# Patient Record
Sex: Female | Born: 1955
Health system: Southern US, Community
[De-identification: ages and names within clinical notes are randomized; demographics above are authoritative.]

## PROBLEM LIST (undated history)

## (undated) DIAGNOSIS — E785 Hyperlipidemia, unspecified: Secondary | ICD-10-CM

## (undated) DIAGNOSIS — R7303 Prediabetes: Secondary | ICD-10-CM

## (undated) DIAGNOSIS — G4733 Obstructive sleep apnea (adult) (pediatric): Secondary | ICD-10-CM

## (undated) DIAGNOSIS — D649 Anemia, unspecified: Secondary | ICD-10-CM

## (undated) HISTORY — PX: COLONOSCOPY: SHX174

## (undated) HISTORY — PX: MYOMECTOMY: SHX85

## (undated) HISTORY — DX: Anemia, unspecified: D64.9

## (undated) HISTORY — DX: Prediabetes: R73.03

## (undated) HISTORY — PX: ABDOMINAL HYSTERECTOMY: SHX81

## (undated) HISTORY — DX: Hyperlipidemia, unspecified: E78.5

## (undated) HISTORY — DX: Obstructive sleep apnea (adult) (pediatric): G47.33

---

## 1998-01-09 ENCOUNTER — Emergency Department (HOSPITAL_COMMUNITY): Admission: EM | Admit: 1998-01-09 | Discharge: 1998-01-09 | Payer: Self-pay

## 1998-04-20 ENCOUNTER — Encounter: Payer: Self-pay | Admitting: Internal Medicine

## 1998-04-20 ENCOUNTER — Ambulatory Visit: Admission: RE | Admit: 1998-04-20 | Discharge: 1998-04-20 | Payer: Self-pay | Admitting: Internal Medicine

## 2000-07-18 ENCOUNTER — Encounter (INDEPENDENT_AMBULATORY_CARE_PROVIDER_SITE_OTHER): Payer: Self-pay | Admitting: Specialist

## 2000-07-18 ENCOUNTER — Ambulatory Visit (HOSPITAL_COMMUNITY): Admission: RE | Admit: 2000-07-18 | Discharge: 2000-07-18 | Payer: Self-pay | Admitting: Obstetrics and Gynecology

## 2001-06-03 ENCOUNTER — Other Ambulatory Visit: Admission: RE | Admit: 2001-06-03 | Discharge: 2001-06-03 | Payer: Self-pay | Admitting: Obstetrics and Gynecology

## 2002-06-16 ENCOUNTER — Other Ambulatory Visit: Admission: RE | Admit: 2002-06-16 | Discharge: 2002-06-16 | Payer: Self-pay | Admitting: Obstetrics and Gynecology

## 2004-04-05 ENCOUNTER — Ambulatory Visit: Payer: Self-pay | Admitting: Internal Medicine

## 2004-04-12 ENCOUNTER — Ambulatory Visit: Payer: Self-pay | Admitting: Internal Medicine

## 2004-06-22 ENCOUNTER — Observation Stay (HOSPITAL_COMMUNITY): Admission: RE | Admit: 2004-06-22 | Discharge: 2004-06-23 | Payer: Self-pay | Admitting: Obstetrics and Gynecology

## 2004-06-22 ENCOUNTER — Encounter (INDEPENDENT_AMBULATORY_CARE_PROVIDER_SITE_OTHER): Payer: Self-pay | Admitting: *Deleted

## 2004-12-13 ENCOUNTER — Ambulatory Visit: Payer: Self-pay | Admitting: Internal Medicine

## 2004-12-19 ENCOUNTER — Ambulatory Visit: Payer: Self-pay | Admitting: Internal Medicine

## 2007-02-18 ENCOUNTER — Ambulatory Visit: Payer: Self-pay | Admitting: Internal Medicine

## 2007-02-18 LAB — CONVERTED CEMR LAB
ALT: 26 units/L (ref 0–35)
AST: 29 units/L (ref 0–37)
Albumin: 3.9 g/dL (ref 3.5–5.2)
Alkaline Phosphatase: 55 units/L (ref 39–117)
BUN: 7 mg/dL (ref 6–23)
Basophils Absolute: 0 10*3/uL (ref 0.0–0.1)
Basophils Relative: 0.6 % (ref 0.0–1.0)
Bilirubin Urine: NEGATIVE
Bilirubin, Direct: 0.1 mg/dL (ref 0.0–0.3)
Blood in Urine, dipstick: NEGATIVE
CO2: 31 meq/L (ref 19–32)
Calcium: 9.9 mg/dL (ref 8.4–10.5)
Chloride: 110 meq/L (ref 96–112)
Cholesterol: 225 mg/dL (ref 0–200)
Creatinine, Ser: 0.9 mg/dL (ref 0.4–1.2)
Direct LDL: 132.8 mg/dL
Eosinophils Absolute: 0.1 10*3/uL (ref 0.0–0.6)
Eosinophils Relative: 2.4 % (ref 0.0–5.0)
GFR calc Af Amer: 86 mL/min
GFR calc non Af Amer: 71 mL/min
Glucose, Bld: 96 mg/dL (ref 70–99)
Glucose, Urine, Semiquant: NEGATIVE
HCT: 37.2 % (ref 36.0–46.0)
HDL: 78.6 mg/dL (ref >39.0)
Hemoglobin: 12.6 g/dL (ref 12.0–15.0)
Ketones, urine, test strip: NEGATIVE
Lymphocytes Relative: 40.6 % (ref 12.0–46.0)
MCHC: 34 g/dL (ref 30.0–36.0)
MCV: 88.5 fL (ref 78.0–100.0)
Monocytes Absolute: 0.3 10*3/uL (ref 0.2–0.7)
Monocytes Relative: 8.1 % (ref 3.0–11.0)
Neutro Abs: 1.7 10*3/uL (ref 1.4–7.7)
Neutrophils Relative %: 48.3 % (ref 43.0–77.0)
Nitrite: NEGATIVE
Platelets: 294 10*3/uL (ref 150–400)
Potassium: 5.4 meq/L — ABNORMAL HIGH (ref 3.5–5.1)
RBC: 4.2 M/uL (ref 3.87–5.11)
RDW: 12.8 % (ref 11.5–14.6)
Sodium: 145 meq/L (ref 135–145)
Specific Gravity, Urine: 1.02
TSH: 1.43 microintl units/mL (ref 0.35–5.50)
Total Bilirubin: 0.7 mg/dL (ref 0.3–1.2)
Total CHOL/HDL Ratio: 2.9
Total Protein: 6.6 g/dL (ref 6.0–8.3)
Triglycerides: 44 mg/dL (ref 0–149)
Urobilinogen, UA: NEGATIVE
VLDL: 9 mg/dL (ref 0–40)
WBC Urine, dipstick: NEGATIVE
WBC: 3.5 10*3/uL — ABNORMAL LOW (ref 4.5–10.5)
pH: 8.5

## 2007-02-25 ENCOUNTER — Ambulatory Visit: Payer: Self-pay | Admitting: Internal Medicine

## 2007-02-25 DIAGNOSIS — D649 Anemia, unspecified: Secondary | ICD-10-CM

## 2007-02-25 DIAGNOSIS — E785 Hyperlipidemia, unspecified: Secondary | ICD-10-CM | POA: Insufficient documentation

## 2007-02-26 ENCOUNTER — Telehealth: Payer: Self-pay | Admitting: *Deleted

## 2007-06-10 ENCOUNTER — Ambulatory Visit: Payer: Self-pay | Admitting: Internal Medicine

## 2007-06-10 LAB — CONVERTED CEMR LAB
Cholesterol: 221 mg/dL (ref 0–200)
Direct LDL: 134.2 mg/dL
HDL: 68.9 mg/dL (ref >39.0)
Total CHOL/HDL Ratio: 3.2
Triglycerides: 43 mg/dL (ref 0–149)
VLDL: 9 mg/dL (ref 0–40)

## 2007-06-17 ENCOUNTER — Ambulatory Visit: Payer: Self-pay | Admitting: Internal Medicine

## 2007-06-17 LAB — CONVERTED CEMR LAB
Cholesterol, target level: 200 mg/dL
HDL goal, serum: 40 mg/dL
LDL Goal: 160 mg/dL

## 2007-08-09 ENCOUNTER — Telehealth: Payer: Self-pay | Admitting: Internal Medicine

## 2007-10-10 LAB — CONVERTED CEMR LAB: Pap Smear: NORMAL

## 2007-12-09 ENCOUNTER — Ambulatory Visit: Payer: Self-pay | Admitting: Internal Medicine

## 2007-12-09 LAB — CONVERTED CEMR LAB
ALT: 17 units/L (ref 0–35)
AST: 26 units/L (ref 0–37)
Albumin: 3.8 g/dL (ref 3.5–5.2)
Alkaline Phosphatase: 45 units/L (ref 39–117)
Bilirubin, Direct: 0.1 mg/dL (ref 0.0–0.3)
Cholesterol: 225 mg/dL (ref 0–200)
Direct LDL: 129.6 mg/dL
HDL: 76.8 mg/dL (ref >39.0)
Total Bilirubin: 0.7 mg/dL (ref 0.3–1.2)
Total CHOL/HDL Ratio: 2.9
Total Protein: 6.3 g/dL (ref 6.0–8.3)
Triglycerides: 46 mg/dL (ref 0–149)
VLDL: 9 mg/dL (ref 0–40)

## 2007-12-16 ENCOUNTER — Ambulatory Visit: Payer: Self-pay | Admitting: Internal Medicine

## 2008-06-15 ENCOUNTER — Ambulatory Visit: Payer: Self-pay | Admitting: Internal Medicine

## 2008-06-15 LAB — CONVERTED CEMR LAB
Albumin: 4.1 g/dL (ref 3.5–5.2)
Alkaline Phosphatase: 52 units/L (ref 39–117)
BUN: 8 mg/dL (ref 6–23)
Basophils Absolute: 0 10*3/uL (ref 0.0–0.1)
Bilirubin Urine: NEGATIVE
Blood in Urine, dipstick: NEGATIVE
Chloride: 106 meq/L (ref 96–112)
Cholesterol: 237 mg/dL (ref 0–200)
Direct LDL: 137.1 mg/dL
Eosinophils Absolute: 0.1 10*3/uL (ref 0.0–0.7)
Eosinophils Relative: 2.2 % (ref 0.0–5.0)
GFR calc Af Amer: 85 mL/min
GFR calc non Af Amer: 70 mL/min
Glucose, Urine, Semiquant: NEGATIVE
HCT: 38.3 % (ref 36.0–46.0)
HDL: 81 mg/dL (ref >39.0)
Ketones, urine, test strip: NEGATIVE
MCHC: 33.4 g/dL (ref 30.0–36.0)
MCV: 89.8 fL (ref 78.0–100.0)
Monocytes Absolute: 0.3 10*3/uL (ref 0.1–1.0)
Neutrophils Relative %: 48.6 % (ref 43.0–77.0)
Nitrite: NEGATIVE
Platelets: 280 10*3/uL (ref 150–400)
Potassium: 4 meq/L (ref 3.5–5.1)
Protein, U semiquant: NEGATIVE
RDW: 12.8 % (ref 11.5–14.6)
Specific Gravity, Urine: 1.02
Total Bilirubin: 0.6 mg/dL (ref 0.3–1.2)
Triglycerides: 58 mg/dL (ref 0–149)
Urobilinogen, UA: 0.2
WBC Urine, dipstick: NEGATIVE
WBC: 3.4 10*3/uL — ABNORMAL LOW (ref 4.5–10.5)
pH: 5.5

## 2008-06-22 ENCOUNTER — Ambulatory Visit: Payer: Self-pay | Admitting: Internal Medicine

## 2008-06-22 DIAGNOSIS — M899 Disorder of bone, unspecified: Secondary | ICD-10-CM | POA: Insufficient documentation

## 2008-06-22 DIAGNOSIS — M949 Disorder of cartilage, unspecified: Secondary | ICD-10-CM

## 2008-06-24 LAB — CONVERTED CEMR LAB: Vit D, 25-Hydroxy: 19 ng/mL — ABNORMAL LOW (ref 30–89)

## 2008-07-08 ENCOUNTER — Ambulatory Visit: Payer: Self-pay | Admitting: Gastroenterology

## 2008-07-22 ENCOUNTER — Ambulatory Visit: Payer: Self-pay | Admitting: Gastroenterology

## 2008-10-28 ENCOUNTER — Encounter: Payer: Self-pay | Admitting: Internal Medicine

## 2008-11-21 LAB — HM MAMMOGRAPHY

## 2008-11-21 LAB — CONVERTED CEMR LAB: Pap Smear: NORMAL

## 2009-01-08 ENCOUNTER — Ambulatory Visit: Payer: Self-pay | Admitting: Internal Medicine

## 2009-01-13 ENCOUNTER — Telehealth: Payer: Self-pay | Admitting: Internal Medicine

## 2009-01-14 ENCOUNTER — Telehealth (INDEPENDENT_AMBULATORY_CARE_PROVIDER_SITE_OTHER): Payer: Self-pay | Admitting: *Deleted

## 2009-01-18 LAB — CONVERTED CEMR LAB: Vit D, 25-Hydroxy: 20 ng/mL — ABNORMAL LOW (ref 30–89)

## 2009-07-12 ENCOUNTER — Ambulatory Visit: Payer: Self-pay | Admitting: Internal Medicine

## 2009-07-12 LAB — CONVERTED CEMR LAB
Albumin: 4.1 g/dL (ref 3.5–5.2)
Alkaline Phosphatase: 67 units/L (ref 39–117)
Basophils Relative: 0.5 % (ref 0.0–3.0)
CO2: 31 meq/L (ref 19–32)
Chloride: 108 meq/L (ref 96–112)
Eosinophils Absolute: 0.1 10*3/uL (ref 0.0–0.7)
HCT: 38.6 % (ref 36.0–46.0)
Hemoglobin: 12.4 g/dL (ref 12.0–15.0)
Lymphocytes Relative: 43.9 % (ref 12.0–46.0)
Lymphs Abs: 1.6 10*3/uL (ref 0.7–4.0)
MCHC: 32.2 g/dL (ref 30.0–36.0)
MCV: 90.3 fL (ref 78.0–100.0)
Monocytes Absolute: 0.2 10*3/uL (ref 0.1–1.0)
Neutro Abs: 1.7 10*3/uL (ref 1.4–7.7)
Nitrite: NEGATIVE
Potassium: 4.8 meq/L (ref 3.5–5.1)
RBC: 4.27 M/uL (ref 3.87–5.11)
Sodium: 142 meq/L (ref 135–145)
Specific Gravity, Urine: 1.02
Total CHOL/HDL Ratio: 3
Total Protein: 7.3 g/dL (ref 6.0–8.3)
Urobilinogen, UA: 0.2

## 2009-07-19 ENCOUNTER — Ambulatory Visit: Payer: Self-pay | Admitting: Internal Medicine

## 2009-07-19 DIAGNOSIS — IMO0002 Reserved for concepts with insufficient information to code with codable children: Secondary | ICD-10-CM | POA: Insufficient documentation

## 2009-07-19 DIAGNOSIS — M75100 Unspecified rotator cuff tear or rupture of unspecified shoulder, not specified as traumatic: Secondary | ICD-10-CM | POA: Insufficient documentation

## 2009-07-19 DIAGNOSIS — K7689 Other specified diseases of liver: Secondary | ICD-10-CM | POA: Insufficient documentation

## 2009-10-13 ENCOUNTER — Ambulatory Visit: Payer: Self-pay | Admitting: Internal Medicine

## 2009-10-13 LAB — CONVERTED CEMR LAB
Alkaline Phosphatase: 103 units/L (ref 39–117)
Bilirubin, Direct: 0.1 mg/dL (ref 0.0–0.3)
Total Protein: 7.1 g/dL (ref 6.0–8.3)
VLDL: 16.4 mg/dL (ref 0.0–40.0)

## 2009-10-20 ENCOUNTER — Ambulatory Visit: Payer: Self-pay | Admitting: Internal Medicine

## 2009-11-01 ENCOUNTER — Encounter: Payer: Self-pay | Admitting: Internal Medicine

## 2010-01-19 ENCOUNTER — Ambulatory Visit: Payer: Self-pay | Admitting: Internal Medicine

## 2010-01-19 LAB — CONVERTED CEMR LAB
Albumin: 3.9 g/dL (ref 3.5–5.2)
HDL: 81.3 mg/dL (ref >39.00)
LDL Cholesterol: 104 mg/dL — ABNORMAL HIGH (ref 0–99)
Total CHOL/HDL Ratio: 2
Triglycerides: 65 mg/dL (ref 0.0–149.0)

## 2010-01-26 ENCOUNTER — Ambulatory Visit: Payer: Self-pay | Admitting: Internal Medicine

## 2010-01-26 DIAGNOSIS — R1901 Right upper quadrant abdominal swelling, mass and lump: Secondary | ICD-10-CM | POA: Insufficient documentation

## 2010-01-28 ENCOUNTER — Ambulatory Visit: Payer: Self-pay | Admitting: Diagnostic Radiology

## 2010-01-28 ENCOUNTER — Ambulatory Visit (HOSPITAL_BASED_OUTPATIENT_CLINIC_OR_DEPARTMENT_OTHER): Admission: RE | Admit: 2010-01-28 | Discharge: 2010-01-28 | Payer: Self-pay | Admitting: Internal Medicine

## 2010-05-03 ENCOUNTER — Ambulatory Visit
Admission: RE | Admit: 2010-05-03 | Discharge: 2010-05-03 | Payer: Self-pay | Source: Home / Self Care | Attending: Internal Medicine | Admitting: Internal Medicine

## 2010-05-03 ENCOUNTER — Other Ambulatory Visit: Payer: Self-pay | Admitting: Internal Medicine

## 2010-05-03 LAB — HEPATIC FUNCTION PANEL
ALT: 48 U/L — ABNORMAL HIGH (ref 0–35)
AST: 36 U/L (ref 0–37)
Albumin: 3.8 g/dL (ref 3.5–5.2)
Alkaline Phosphatase: 91 U/L (ref 39–117)
Bilirubin, Direct: 0.1 mg/dL (ref 0.0–0.3)
Total Bilirubin: 0.6 mg/dL (ref 0.3–1.2)
Total Protein: 6.7 g/dL (ref 6.0–8.3)

## 2010-05-11 ENCOUNTER — Ambulatory Visit
Admission: RE | Admit: 2010-05-11 | Discharge: 2010-05-11 | Payer: Self-pay | Source: Home / Self Care | Attending: Internal Medicine | Admitting: Internal Medicine

## 2010-05-24 NOTE — Assessment & Plan Note (Signed)
Summary: cpx/mm   Vital Signs:  Patient profile:   55 year old female Height:      65 inches Weight:      192 pounds BMI:     32.07 Pulse rate:   80 / minute Resp:     14 per minute BP sitting:   130 / 80  (left arm)  Vitals Entered By: Willy Eddy, LPN (July 19, 2009 3:16 PM)  Nutrition Counseling: Patient's BMI is greater than 25 and therefore counseled on weight management options. CC: cpx- stopped vitamin d 50,000 units twice a week after 3 months-   CC:  cpx- stopped vitamin d 50 and 000 units twice a week after 3 months-.  History of Present Illness: The pt was asked about all immunizations, health maint. services that are appropriate to their age and was given guidance on diet exercize  and weight management  pt has shoulder pian ay night and cannot lay on the right side motion is limited to 90o pain with combing hair no acute injury  Preventive Screening-Counseling & Management  Alcohol-Tobacco     Smoking Status: quit     Year Quit: 1980     Passive Smoke Exposure: no  Current Problems (verified): 1)  Osteopenia  (ICD-733.90) 2)  Preventive Health Care  (ICD-V70.0) 3)  Family History Diabetes 1st Degree Relative  (ICD-V18.0) 4)  Hyperlipidemia  (ICD-272.4) 5)  Anemia-nos  (ICD-285.9)  Current Medications (verified): 1)  Calcium 500/d 500-125 Mg-Unit  Tabs (Calcium Carbonate-Vitamin D) .... Two Times A Day 2)  Fish Oil 1000 Mg Caps (Omega-3 Fatty Acids) .Marland Kitchen.. 1 Once Daily 3)  Trilipix 135 Mg Cpdr (Choline Fenofibrate) .... One By Mouth Daily 4)  Calcium + D 600-200 Mg-Unit Tabs (Calcium Carbonate-Vitamin D) .Marland Kitchen.. 1 Once Daily  Allergies (verified): No Known Drug Allergies  Past History:  Family History: Last updated: 01/08/2009 mother Family History Diabetes 1st degree relative Family History Hypertension father Family History of Prostate CA 1st degree relative <50 Father:  Mother:  Siblings:   Social History: Last updated:  02/25/2007 Married Former Smoker Alcohol use-no  Risk Factors: Smoking Status: quit (07/19/2009) Passive Smoke Exposure: no (07/19/2009)  Past medical, surgical, family and social histories (including risk factors) reviewed, and no changes noted (except as noted below).  Past Medical History: Reviewed history from 02/25/2007 and no changes required. Anemia-NOS Hyperlipidemia  Past Surgical History: Reviewed history from 02/25/2007 and no changes required. Hysterectomy myomectomy  Family History: Reviewed history from 01/08/2009 and no changes required. mother Family History Diabetes 1st degree relative Family History Hypertension father Family History of Prostate CA 1st degree relative <50 Father:  Mother:  Siblings:   Social History: Reviewed history from 02/25/2007 and no changes required. Married Former Smoker Alcohol use-no  Review of Systems  The patient denies anorexia, fever, weight loss, weight gain, vision loss, decreased hearing, hoarseness, chest pain, syncope, dyspnea on exertion, peripheral edema, prolonged cough, headaches, hemoptysis, abdominal pain, melena, hematochezia, severe indigestion/heartburn, hematuria, incontinence, genital sores, muscle weakness, suspicious skin lesions, transient blindness, difficulty walking, depression, unusual weight change, abnormal bleeding, enlarged lymph nodes, angioedema, and breast masses.    Contraindications/Deferment of Procedures/Staging:    Test/Procedure: FLU VAX    Reason for deferment: patient declined   Physical Exam  General:  Well-developed,well-nourished,in no acute distress; alert,appropriate and cooperative throughout examination Head:  Normocephalic and atraumatic without obvious abnormalities. No apparent alopecia or balding. Eyes:  pupils equal and pupils round.   Ears:  R ear normal  and L ear normal.   Nose:  no external deformity and no nasal discharge.   Neck:  No deformities, masses, or  tenderness noted. Lungs:  Normal respiratory effort, chest expands symmetrically. Lungs are clear to auscultation, no crackles or wheezes. Heart:  Normal rate and regular rhythm. S1 and S2 normal without gallop, murmur, click, rub or other extra sounds. Abdomen:  Bowel sounds positive,abdomen soft and non-tender without masses, organomegaly or hernias noted. Msk:  decreased ROM and joint tenderness.  right shoulder Pulses:  R and L carotid,radial,femoral,dorsalis pedis and posterior tibial pulses are full and equal bilaterally Extremities:  No clubbing, cyanosis, edema, or deformity noted with normal full range of motion of all joints.   Neurologic:  No cranial nerve deficits noted. Station and gait are normal. Plantar reflexes are down-going bilaterally. DTRs are symmetrical throughout. Sensory, motor and coordinative functions appear intact.   Impression & Recommendations:  Problem # 1:  PREVENTIVE HEALTH CARE (ICD-V70.0)  Mammogram: normal (11/21/2008) Pap smear: normal (11/21/2008) Colonoscopy: Location:  [facility].   (07/22/2008) Td Booster: Tdap (02/25/2007)   Chol: 236 (07/12/2009)   HDL: 88.30 (07/12/2009)   LDL: DEL (06/15/2008)   TG: 73.0 (07/12/2009) TSH: 1.84 (07/12/2009)   Next mammogram due:: 11/2009 (07/19/2009) Next Colonoscopy due:: 07/2018 (07/22/2008)  Discussed using sunscreen, use of alcohol, drug use, self breast exam, routine dental care, routine eye care, schedule for GYN exam, routine physical exam, seat belts, multiple vitamins, osteoporosis prevention, adequate calcium intake in diet, recommendations for immunizations, mammograms and Pap smears.  Discussed exercise and checking cholesterol.  Discussed gun safety, safe sex, and contraception.  Problem # 2:  FATTY LIVER DISEASE (ICD-571.8)  Problem # 3:  HYPERLIPIDEMIA (ICD-272.4)  The following medications were removed from the medication list:    Trilipix 135 Mg Cpdr (Choline fenofibrate) .....  One by mouth daily Her updated medication list for this problem includes:    Crestor 20 Mg Tabs (Rosuvastatin calcium) ..... One by mouth every  monday night  Labs Reviewed: SGOT: 53 (07/12/2009)   SGPT: 60 (07/12/2009)  Lipid Goals: Chol Goal: 200 (06/17/2007)   HDL Goal: 40 (06/17/2007)   LDL Goal: 160 (06/17/2007)   TG Goal: 150 (06/17/2007)  Prior 10 Yr Risk Heart Disease: Not enough information (06/17/2007)   HDL:88.30 (07/12/2009), 81.0 (06/15/2008)  LDL:DEL (06/15/2008), DEL (12/09/2007)  Chol:236 (07/12/2009), 237 (06/15/2008)  Trig:73.0 (07/12/2009), 58 (06/15/2008)  Problem # 4:  SUBACROMIAL BURSITIS, RIGHT (ICD-726.19)  Informed consent obtained and then the joint was prepped in a sterile manor and 40 mg depo and 1/2 cc 1% lidocaine injected into the synovial space. After care discussed. Pt tolerated procedure well.  Orders: Joint Aspirate / Injection, Large (20610) Depo- Medrol 40mg  (J1030)  Complete Medication List: 1)  Calcium 500/d 500-125 Mg-unit Tabs (Calcium carbonate-vitamin d) .... Two times a day 2)  Fish Oil 1000 Mg Caps (Omega-3 fatty acids) .Marland Kitchen.. 1 once daily 3)  Calcium + D 600-200 Mg-unit Tabs (Calcium carbonate-vitamin d) .Marland Kitchen.. 1 once daily 4)  Crestor 20 Mg Tabs (Rosuvastatin calcium) .... One by mouth every  monday night  Patient Instructions: 1)  Please schedule a follow-up appointment in 3 months. 2)  Hepatic Panel prior to visit, ICD-9:995.20 3)  Lipid Panel prior to visit, ICD-9:272.4     Preventive Care Screening  Mammogram:    Date:  11/21/2008    Next Due:  11/2009    Results:  normal   Pap Smear:    Date:  11/21/2008  Next Due:  11/2009    Results:  normal

## 2010-05-24 NOTE — Assessment & Plan Note (Signed)
Summary: 3 MTH ROV // RS   Vital Signs:  Patient profile:   55 year old female Height:      65 inches Weight:      195 pounds BMI:     32.57 Temp:     98.2 degrees F oral Pulse rate:   80 / minute Resp:     14 per minute BP sitting:   132 / 80  (left arm)  Vitals Entered By: Willy Eddy, LPN (October 20, 2009 2:47 PM) CC: elevated liver enzymeroa labs after chaning trilipix to crestor 20 q week, Lipid Management   CC:  elevated liver enzymeroa labs after chaning trilipix to crestor 20 q week and Lipid Management.  History of Present Illness: has not lost weight pt has fatty liver follow up trial of weekly  need to eat 3-4 meals  Lipid Management History:      Negative NCEP/ATP III risk factors include female age less than 65 years old, HDL cholesterol greater than 60, no family history for ischemic heart disease, non-tobacco-user status, non-hypertensive, no ASHD (atherosclerotic heart disease), no prior stroke/TIA, no peripheral vascular disease, and no history of aortic aneurysm.     Preventive Screening-Counseling & Management  Alcohol-Tobacco     Smoking Status: quit     Year Quit: 1980     Passive Smoke Exposure: no  Problems Prior to Update: 1)  Subacromial Bursitis, Right  (ICD-726.19) 2)  Fatty Liver Disease  (ICD-571.8) 3)  Osteopenia  (ICD-733.90) 4)  Preventive Health Care  (ICD-V70.0) 5)  Family History Diabetes 1st Degree Relative  (ICD-V18.0) 6)  Hyperlipidemia  (ICD-272.4) 7)  Anemia-nos  (ICD-285.9)  Current Problems (verified): 1)  Subacromial Bursitis, Right  (ICD-726.19) 2)  Fatty Liver Disease  (ICD-571.8) 3)  Osteopenia  (ICD-733.90) 4)  Preventive Health Care  (ICD-V70.0) 5)  Family History Diabetes 1st Degree Relative  (ICD-V18.0) 6)  Hyperlipidemia  (ICD-272.4) 7)  Anemia-nos  (ICD-285.9)  Medications Prior to Update: 1)  Calcium 500/d 500-125 Mg-Unit  Tabs (Calcium Carbonate-Vitamin D) .... Two Times A Day 2)  Fish Oil 1000 Mg Caps  (Omega-3 Fatty Acids) .Marland Kitchen.. 1 Once Daily 3)  Calcium + D 600-200 Mg-Unit Tabs (Calcium Carbonate-Vitamin D) .Marland Kitchen.. 1 Once Daily 4)  Crestor 20 Mg Tabs (Rosuvastatin Calcium) .... One By Mouth Every  Monday Night  Current Medications (verified): 1)  Fish Oil 1000 Mg Caps (Omega-3 Fatty Acids) .Marland Kitchen.. 1 Once Daily 2)  Calcium + D 600-200 Mg-Unit Tabs (Calcium Carbonate-Vitamin D) .Marland Kitchen.. 1 Once Daily 3)  Crestor 20 Mg Tabs (Rosuvastatin Calcium) .... One By Mouth Every  Monday Night  Allergies (verified): No Known Drug Allergies  Past History:  Family History: Last updated: 01/08/2009 mother Family History Diabetes 1st degree relative Family History Hypertension father Family History of Prostate CA 1st degree relative <50 Father:  Mother:  Siblings:   Social History: Last updated: 02/25/2007 Married Former Smoker Alcohol use-no  Risk Factors: Smoking Status: quit (10/20/2009) Passive Smoke Exposure: no (10/20/2009)  Past medical, surgical, family and social histories (including risk factors) reviewed, and no changes noted (except as noted below).  Past Medical History: Reviewed history from 02/25/2007 and no changes required. Anemia-NOS Hyperlipidemia  Past Surgical History: Reviewed history from 02/25/2007 and no changes required. Hysterectomy myomectomy  Family History: Reviewed history from 01/08/2009 and no changes required. mother Family History Diabetes 1st degree relative Family History Hypertension father Family History of Prostate CA 1st degree relative <50 Father:  Mother:  Siblings:  Social History: Reviewed history from 02/25/2007 and no changes required. Married Former Smoker Alcohol use-no  Review of Systems  The patient denies anorexia, fever, weight loss, weight gain, vision loss, decreased hearing, hoarseness, chest pain, syncope, dyspnea on exertion, peripheral edema, prolonged cough, headaches, hemoptysis, abdominal pain, melena,  hematochezia, severe indigestion/heartburn, hematuria, incontinence, genital sores, muscle weakness, suspicious skin lesions, transient blindness, difficulty walking, depression, unusual weight change, abnormal bleeding, enlarged lymph nodes, angioedema, and breast masses.    Physical Exam  General:  Well-developed,well-nourished,in no acute distress; alert,appropriate and cooperative throughout examination Head:  Normocephalic and atraumatic without obvious abnormalities. No apparent alopecia or balding. Eyes:  pupils equal and pupils round.   Ears:  R ear normal and L ear normal.   Nose:  no external deformity and no nasal discharge.   Mouth:  Oral mucosa and oropharynx without lesions or exudates.  Teeth in good repair. Neck:  No deformities, masses, or tenderness noted. Lungs:  Normal respiratory effort, chest expands symmetrically. Lungs are clear to auscultation, no crackles or wheezes. Heart:  Normal rate and regular rhythm. S1 and S2 normal without gallop, murmur, click, rub or other extra sounds.   Impression & Recommendations:  Problem # 1:  FATTY LIVER DISEASE (ICD-571.8) moderate inflamations chronic needed weight loss  Problem # 2:  HYPERLIPIDEMIA (ICD-272.4)  Her updated medication list for this problem includes:    Crestor 20 Mg Tabs (Rosuvastatin calcium) ..... One by mouth every  monday  and fridau night  Labs Reviewed: SGOT: 53 (07/12/2009)   SGPT: 60 (07/12/2009)  Lipid Goals: Chol Goal: 200 (06/17/2007)   HDL Goal: 40 (06/17/2007)   LDL Goal: 160 (06/17/2007)   TG Goal: 150 (06/17/2007)  Prior 10 Yr Risk Heart Disease: Not enough information (06/17/2007)   HDL:88.30 (07/12/2009), 81.0 (06/15/2008)  LDL:DEL (06/15/2008), DEL (12/09/2007)  Chol:236 (07/12/2009), 237 (06/15/2008)  Trig:73.0 (07/12/2009), 58 (06/15/2008)  Complete Medication List: 1)  Fish Oil 1000 Mg Caps (Omega-3 fatty acids) .Marland Kitchen.. 1 once daily 2)  Calcium + D 600-200 Mg-unit Tabs (Calcium  carbonate-vitamin d) .Marland Kitchen.. 1 once daily 3)  Crestor 20 Mg Tabs (Rosuvastatin calcium) .... One by mouth every  monday  and fridau night  Lipid Assessment/Plan:      Based on NCEP/ATP III, the patient's risk factor category is "0-1 risk factors".  The patient's lipid goals are as follows: Total cholesterol goal is 200; LDL cholesterol goal is 160; HDL cholesterol goal is 40; Triglyceride goal is 150.    Patient Instructions: 1)  Please schedule a follow-up appointment in 3 months. 2)  Hepatic Panel prior to visit, ICD-9:995.20 3)  Lipid Panel prior to visit, ICD-9:272.4

## 2010-05-24 NOTE — Assessment & Plan Note (Signed)
Summary: 3 month rov/njr   Vital Signs:  Patient profile:   55 year old female Height:      65 inches Weight:      198 pounds BMI:     33.07 Temp:     98.2 degrees F oral Pulse rate:   72 / minute Resp:     14 per minute BP sitting:   110 / 76  (left arm)  Vitals Entered By: Willy Eddy, LPN (January 26, 2010 2:52 PM)  Nutrition Counseling: Patient's BMI is greater than 25 and therefore counseled on weight management options. CC: roa labs, Lipid Management Is Patient Diabetic? No   Primary Care Provider:  Stacie Glaze MD  CC:  roa labs and Lipid Management.  History of Present Illness:  Hyperlipidemia Follow-Up      This is a 55 year old woman who presents for Hyperlipidemia follow-up.  The patient denies muscle aches, GI upset, abdominal pain, flushing, itching, constipation, diarrhea, and fatigue.  The patient denies the following symptoms: chest pain/pressure, exercise intolerance, dypsnea, palpitations, syncope, and pedal edema.  Compliance with medications (by patient report) has been intermittent.  Dietary compliance has been good.  The patient reports exercising 3-4X per week.  Adjunctive measures currently used by the patient include weight reduction.    Lipid Management History:      Negative NCEP/ATP III risk factors include female age less than 26 years old, HDL cholesterol greater than 60, no family history for ischemic heart disease, non-tobacco-user status, non-hypertensive, no ASHD (atherosclerotic heart disease), no prior stroke/TIA, no peripheral vascular disease, and no history of aortic aneurysm.     Preventive Screening-Counseling & Management  Alcohol-Tobacco     Smoking Status: quit     Year Quit: 1980     Passive Smoke Exposure: no     Tobacco Counseling: to remain off tobacco products  Problems Prior to Update: 1)  Abdominal or Pelvic Swelling Mass or Lump Ruq  (ICD-789.31) 2)  Subacromial Bursitis, Right  (ICD-726.19) 3)  Fatty Liver  Disease  (ICD-571.8) 4)  Osteopenia  (ICD-733.90) 5)  Preventive Health Care  (ICD-V70.0) 6)  Family History Diabetes 1st Degree Relative  (ICD-V18.0) 7)  Hyperlipidemia  (ICD-272.4) 8)  Anemia-nos  (ICD-285.9)  Current Problems (verified): 1)  Subacromial Bursitis, Right  (ICD-726.19) 2)  Fatty Liver Disease  (ICD-571.8) 3)  Osteopenia  (ICD-733.90) 4)  Preventive Health Care  (ICD-V70.0) 5)  Family History Diabetes 1st Degree Relative  (ICD-V18.0) 6)  Hyperlipidemia  (ICD-272.4) 7)  Anemia-nos  (ICD-285.9)  Medications Prior to Update: 1)  Fish Oil 1000 Mg Caps (Omega-3 Fatty Acids) .Marland Kitchen.. 1 Once Daily 2)  Calcium + D 600-200 Mg-Unit Tabs (Calcium Carbonate-Vitamin D) .Marland Kitchen.. 1 Once Daily 3)  Crestor 20 Mg Tabs (Rosuvastatin Calcium) .... One By Mouth Every  Monday  and Fridau Night  Current Medications (verified): 1)  Fish Oil 1000 Mg Caps (Omega-3 Fatty Acids) .Marland Kitchen.. 1 Once Daily 2)  Calcium + D 600-200 Mg-Unit Tabs (Calcium Carbonate-Vitamin D) .Marland Kitchen.. 1 Once Daily 3)  Crestor 20 Mg Tabs (Rosuvastatin Calcium) .... One By Mouth Every  Monday  and Fridau Night  Allergies (verified): No Known Drug Allergies  Contraindications/Deferment of Procedures/Staging:    Test/Procedure: FLU VAX    Reason for deferment: patient declined   Past History:  Family History: Last updated: 01/08/2009 mother Family History Diabetes 1st degree relative Family History Hypertension father Family History of Prostate CA 1st degree relative <50 Father:  Mother:  Siblings:   Social History: Last updated: 02/25/2007 Married Former Smoker Alcohol use-no  Risk Factors: Smoking Status: quit (01/26/2010) Passive Smoke Exposure: no (01/26/2010)  Past medical, surgical, family and social histories (including risk factors) reviewed, and no changes noted (except as noted below).  Past Medical History: Reviewed history from 02/25/2007 and no changes required. Anemia-NOS Hyperlipidemia  Past  Surgical History: Reviewed history from 02/25/2007 and no changes required. Hysterectomy myomectomy  Family History: Reviewed history from 01/08/2009 and no changes required. mother Family History Diabetes 1st degree relative Family History Hypertension father Family History of Prostate CA 1st degree relative <50 Father:  Mother:  Siblings:   Social History: Reviewed history from 02/25/2007 and no changes required. Married Former Smoker Alcohol use-no  Review of Systems  The patient denies anorexia, fever, weight loss, weight gain, vision loss, decreased hearing, hoarseness, chest pain, syncope, dyspnea on exertion, peripheral edema, prolonged cough, headaches, hemoptysis, abdominal pain, melena, hematochezia, severe indigestion/heartburn, hematuria, incontinence, genital sores, muscle weakness, suspicious skin lesions, transient blindness, difficulty walking, depression, unusual weight change, abnormal bleeding, enlarged lymph nodes, angioedema, and breast masses.    Physical Exam  General:  Well-developed,well-nourished,in no acute distress; alert,appropriate and cooperative throughout examination Head:  Normocephalic and atraumatic without obvious abnormalities. No apparent alopecia or balding. Eyes:  pupils equal and pupils round.   Ears:  R ear normal and L ear normal.   Nose:  no external deformity and no nasal discharge.   Mouth:  Oral mucosa and oropharynx without lesions or exudates.  Teeth in good repair. Neck:  No deformities, masses, or tenderness noted. Lungs:  Normal respiratory effort, chest expands symmetrically. Lungs are clear to auscultation, no crackles or wheezes. Heart:  Normal rate and regular rhythm. S1 and S2 normal without gallop, murmur, click, rub or other extra sounds.   Impression & Recommendations:  Problem # 1:  FATTY LIVER DISEASE (ICD-571.8) [place] of liver Orders: Radiology Referral (Radiology)  Problem # 2:  ABDOMINAL OR PELVIC SWELLING  MASS OR LUMP RUQ (ICD-789.31) persistant elevastion of the liver functons with presumed fatty lover dz slight change in lfts with a more obstructive pattern Orders: Radiology Referral (Radiology)  Problem # 3:  ANEMIA-NOS (ICD-285.9) Assessment: Unchanged  Hgb: 12.4 (07/12/2009)   Hct: 38.6 (07/12/2009)   Platelets: 246.0 (07/12/2009) RBC: 4.27 (07/12/2009)   RDW: 13.2 (07/12/2009)   WBC: 3.6 (07/12/2009) MCV: 90.3 (07/12/2009)   MCHC: 32.2 (07/12/2009) TSH: 1.84 (07/12/2009)  Complete Medication List: 1)  Fish Oil 1000 Mg Caps (Omega-3 fatty acids) .Marland Kitchen.. 1 once daily 2)  Calcium + D 600-200 Mg-unit Tabs (Calcium carbonate-vitamin d) .Marland Kitchen.. 1 once daily 3)  Crestor 20 Mg Tabs (Rosuvastatin calcium) .... One by mouth every monday and friday nights  Lipid Assessment/Plan:      Based on NCEP/ATP III, the patient's risk factor category is "0-1 risk factors".  The patient's lipid goals are as follows: Total cholesterol goal is 200; LDL cholesterol goal is 160; HDL cholesterol goal is 40; Triglyceride goal is 150.    Patient Instructions: 1)  Please schedule a follow-up appointment in 3 months. 2)  Hepatic Panel prior to visit, ICD-9:995.20 Prescriptions: CRESTOR 20 MG TABS (ROSUVASTATIN CALCIUM) one by mouth daily  #30 x 0   Entered and Authorized by:   Stacie Glaze MD   Signed by:   Stacie Glaze MD on 01/26/2010   Method used:   Print then Give to Patient   RxID:   [id number]

## 2010-05-26 NOTE — Assessment & Plan Note (Signed)
Summary: 3 MONTH ROV/NJR   Vital Signs:  Patient profile:   55 year old female Height:      65 inches Weight:      198 pounds BMI:     33.07 Temp:     98.2 degrees F oral Pulse rate:   72 / minute Resp:     14 per minute BP sitting:   116 / 76  (left arm)  Vitals Entered By: Willy Eddy, LPN (May 11, 2010 4:15 PM) CC: roa labs, Lipid Management Is Patient Diabetic? No   Primary Care Provider:  Stacie Glaze MD  CC:  roa labs and Lipid Management.  History of Present Illness: the pt presents for fatty liver follow up had a weigth loss epiphany and began to change her diet and work out with marked results in the liver functions  Lipid Management History:      Negative NCEP/ATP III risk factors include female age less than 63 years old, HDL cholesterol greater than 60, no family history for ischemic heart disease, non-tobacco-user status, non-hypertensive, no ASHD (atherosclerotic heart disease), no prior stroke/TIA, no peripheral vascular disease, and no history of aortic aneurysm.     Preventive Screening-Counseling & Management  Alcohol-Tobacco     Smoking Status: quit     Year Quit: 1980     Passive Smoke Exposure: no     Tobacco Counseling: to remain off tobacco products  Problems Prior to Update: 1)  Abdominal or Pelvic Swelling Mass or Lump Ruq  (ICD-789.31) 2)  Subacromial Bursitis, Right  (ICD-726.19) 3)  Fatty Liver Disease  (ICD-571.8) 4)  Osteopenia  (ICD-733.90) 5)  Preventive Health Care  (ICD-V70.0) 6)  Family History Diabetes 1st Degree Relative  (ICD-V18.0) 7)  Hyperlipidemia  (ICD-272.4) 8)  Anemia-nos  (ICD-285.9)  Current Problems (verified): 1)  Abdominal or Pelvic Swelling Mass or Lump Ruq  (ICD-789.31) 2)  Subacromial Bursitis, Right  (ICD-726.19) 3)  Fatty Liver Disease  (ICD-571.8) 4)  Osteopenia  (ICD-733.90) 5)  Preventive Health Care  (ICD-V70.0) 6)  Family History Diabetes 1st Degree Relative  (ICD-V18.0) 7)  Hyperlipidemia   (ICD-272.4) 8)  Anemia-nos  (ICD-285.9)  Medications Prior to Update: 1)  Fish Oil 1000 Mg Caps (Omega-3 Fatty Acids) .Marland Kitchen.. 1 Once Daily 2)  Calcium + D 600-200 Mg-Unit Tabs (Calcium Carbonate-Vitamin D) .Marland Kitchen.. 1 Once Daily 3)  Crestor 20 Mg Tabs (Rosuvastatin Calcium) .... One By Mouth Every Monday and Friday Nights  Current Medications (verified): 1)  Fish Oil 1000 Mg Caps (Omega-3 Fatty Acids) .Marland Kitchen.. 1 Once Daily 2)  Calcium + D 600-200 Mg-Unit Tabs (Calcium Carbonate-Vitamin D) .Marland Kitchen.. 1 Once Daily 3)  Crestor 20 Mg Tabs (Rosuvastatin Calcium) .... One By Mouth Every Monday and Friday Nights  Allergies (verified): No Known Drug Allergies  Past History:  Family History: Last updated: 01/08/2009 mother Family History Diabetes 1st degree relative Family History Hypertension father Family History of Prostate CA 1st degree relative <50 Father:  Mother:  Siblings:   Social History: Last updated: 02/25/2007 Married Former Smoker Alcohol use-no  Risk Factors: Smoking Status: quit (05/11/2010) Passive Smoke Exposure: no (05/11/2010)  Past medical, surgical, family and social histories (including risk factors) reviewed, and no changes noted (except as noted below).  Past Medical History: Reviewed history from 02/25/2007 and no changes required. Anemia-NOS Hyperlipidemia  Past Surgical History: Reviewed history from 02/25/2007 and no changes required. Hysterectomy myomectomy  Family History: Reviewed history from 01/08/2009 and no changes required. mother Family History Diabetes  1st degree relative Family History Hypertension father Family History of Prostate CA 1st degree relative <50 Father:  Mother:  Siblings:   Social History: Reviewed history from 02/25/2007 and no changes required. Married Former Smoker Alcohol use-no  Review of Systems  The patient denies anorexia, fever, weight loss, weight gain, vision loss, decreased hearing, hoarseness, chest pain,  syncope, dyspnea on exertion, peripheral edema, prolonged cough, headaches, hemoptysis, abdominal pain, melena, hematochezia, severe indigestion/heartburn, hematuria, incontinence, genital sores, muscle weakness, suspicious skin lesions, transient blindness, difficulty walking, depression, unusual weight change, abnormal bleeding, enlarged lymph nodes, angioedema, and breast masses.    Physical Exam  General:  Well-developed,well-nourished,in no acute distress; alert,appropriate and cooperative throughout examination Head:  Normocephalic and atraumatic without obvious abnormalities. No apparent alopecia or balding. Eyes:  pupils equal and pupils round.   Neck:  No deformities, masses, or tenderness noted. Lungs:  Normal respiratory effort, chest expands symmetrically. Lungs are clear to auscultation, no crackles or wheezes. Heart:  Normal rate and regular rhythm. S1 and S2 normal without gallop, murmur, click, rub or other extra sounds. Abdomen:  Bowel sounds positive,abdomen soft and non-tender without masses, organomegaly or hernias noted.   Impression & Recommendations:  Problem # 1:  FATTY LIVER DISEASE (ICD-571.8) Assessment Improved much improved liver functions [place] reviewed with pt as to etiology  Problem # 2:  HYPERLIPIDEMIA (ICD-272.4) Assessment: Improved the crestor twice a week is working Her updated medication list for this problem includes:    Crestor 20 Mg Tabs (Rosuvastatin calcium) ..... One by mouth every monday and friday nights  Labs Reviewed: SGOT: 36 (05/03/2010)   SGPT: 48 (05/03/2010)  Lipid Goals: Chol Goal: 200 (06/17/2007)   HDL Goal: 40 (06/17/2007)   LDL Goal: 160 (06/17/2007)   TG Goal: 150 (06/17/2007)  Prior 10 Yr Risk Heart Disease: 2 % (01/26/2010)   HDL:81.30 (01/19/2010), 86.70 (10/13/2009)  LDL:104 (01/19/2010), DEL (06/15/2008)  Chol:198 (01/19/2010), 229 (10/13/2009)  Trig:65.0 (01/19/2010), 82.0 (10/13/2009)  Complete Medication List: 1)   Fish Oil 1000 Mg Caps (Omega-3 fatty acids) .Marland Kitchen.. 1 once daily 2)  Calcium + D 600-200 Mg-unit Tabs (Calcium carbonate-vitamin d) .Marland Kitchen.. 1 once daily 3)  Crestor 20 Mg Tabs (Rosuvastatin calcium) .... One by mouth every monday and friday nights  Lipid Assessment/Plan:      Based on NCEP/ATP III, the patient's risk factor category is "0-1 risk factors".  The patient's lipid goals are as follows: Total cholesterol goal is 200; LDL cholesterol goal is 160; HDL cholesterol goal is 40; Triglyceride goal is 150.    Patient Instructions: 1)  weigth goal 160 and the pt can come off the crestor 2)  Please schedule a follow-up appointment in 5 months. 3)  Hepatic Panel prior to visit, ICD-9:995.20 4)  Lipid Panel prior to visit, ICD-9:272.4   Orders Added: 1)  Est. Patient Level III [24401]

## 2010-09-09 NOTE — Discharge Summary (Signed)
Shelia Smith, Shelia Smith               ACCOUNT NO.:  [id number]   MEDICAL RECORD NO.:  [id number]          PATIENT TYPE:  OBV   LOCATION:  9313                          FACILITY:  [facility]   PHYSICIAN:  Zenaida Niece, M.D.DATE OF BIRTH:  July 04, 1955   DATE OF ADMISSION:  06/22/2004  DATE OF DISCHARGE:  06/23/2004                                 DISCHARGE SUMMARY   ADMISSION AND DISCHARGE DIAGNOSES:  Symptomatic leiomyomatous uterus.   PROCEDURES:  On June 22, 2004, she had a vaginal hysterectomy and  cystoscopy.   HISTORY AND PHYSICAL:  For full history and physical please see the chart  but briefly this is a 55 year old black female gravida 4, para 3-0-1-3 with  symptomatic fibroids who wishes to proceed with definitive surgical therapy.   PAST HISTORY:  Significant for three cesarean sections, prior hysteroscopy  with removal of a submucosal fibroid, hypercholesterolemia and anemia.   PHYSICAL EXAM:  Significant for benign abdomen with a well-healed transverse  scar and on pelvic examination, uterus was slightly enlarged, retroverted,  nontender and she had no adnexal masses.   HOSPITAL COURSE:  The patient was admitted on the day of surgery and  underwent vaginal hysterectomy under general anesthesia. Estimated blood  loss was 100 cc. She had an eight weeks' size uterus with normal tubes and  ovaries. By cystoscopy, bladder was normal and ureters were patent.  Postoperatively she had no significant complications. Preoperative  hemoglobin 12.2, postoperative 8.2 and on the morning postoperative day #1,  the patient was felt to be stable enough for discharge home. She was  tolerating a diet, ambulating and taking oral pain medicine.   DISCHARGE INSTRUCTIONS:  Regular diet, pelvic rest, no strenuous activity.  Follow-up is in six weeks. Medications are Percocet #40 one to two p.o. q.4-  6 hours p.r.n. pain and over-the-counter ibuprofen as needed.      TDM/MEDQ  D:  06/23/2004  T:   06/23/2004  Job:  [id number]

## 2010-09-09 NOTE — Op Note (Signed)
Smith, Shelia               ACCOUNT NO.:  [id number]   MEDICAL RECORD NO.:  [id number]          PATIENT TYPE:  OBV   LOCATION:  9399                          FACILITY:  [facility]   PHYSICIAN:  Zenaida Niece, M.D.DATE OF BIRTH:  1955/12/21   DATE OF PROCEDURE:  06/22/2004  DATE OF DISCHARGE:                                 OPERATIVE REPORT   PREOPERATIVE DIAGNOSIS:  Symptomatic leiomyomatous uterus.   POSTOPERATIVE DIAGNOSIS:  Symptomatic leiomyomatous uterus.   PROCEDURE:  Transvaginal hysterectomy and cystoscopy.   SURGEON:  Zenaida Niece, M.D.   ASSISTANT:  Huel Cote, M.D.   ANESTHESIA:  General endotracheal tube.   SPECIMENS:  Uterus.   ESTIMATED BLOOD LOSS:  100 cc.   FINDINGS:  The patient had an approximately 8-week size uterus, with normal  tubes and ovaries.   PROCEDURE IN DETAIL:  The patient was taken to the operating room and placed  in the dorsal supine position.  General anesthesia was induced and she was  placed in mobile stirrups.  Lower abdomen, perineum and vagina were then  prepped and draped in the usual sterile fashion; bladder was drained with a  red rubber catheter.  The legs were elevated in stirrups and a weighted  speculum inserted into the vagina.  The cervix was grasped with Jacob's  tenaculum and the cervicovaginal mucosa infiltrated with a dilute solution  of Pitressin.   The mucosa was then incised circumferentially with electrocautery.  Sharp  dissection was used to dissect the vagina from the cervix, and the posterior  cul-de-sac was identified.  It was pushed off of the cervix and uterus, but  was not initially entered.  The bladder was pushed off anterior, but the  anterior peritoneum was not initially entered.  The uterosacral ligaments  were clamped, transected and ligated on each side; tagged for later use.  The posterior peritoneum was then entered and a Bonanno speculum placed into  the posterior cul-de-sac.  Bladder  was further dissected off anteriorly.  Cardinal ligaments and uterine arteries were clamped, transected and ligated  on each side with #1 chromic.  The anterior peritoneum was then able to be  identified and entered sharply.  A deaver retractor was used to retract the  bladder anteriorly.  Lower broad ligaments were clamped, transected and  ligated on each side.  The uterine fundus was then delivered posteriorly.  On the left side three clamps were used to clamp, transect and suture ligate  the remaining pedicles.  The utero-ovarian pedicle was tagged for  inspection.  On the patient's right side, two pedicles were left and these  were clamped, transected, ligated and tagged for inspection.  Bleeding on  the right side was controlled with two figure-of-eight sutures of #1  chromic.  Bleeding from vaginal cuff edges was controlled with  electrocautery and two figure-of-eight stitches of #1 chromic.  Both tubes  and ovaries were inspected and found to be normal.  These pedicles were then  released once found to be hemostatic.  Uterosacral ligaments were then  plicated in the midline with 2-0 silk.  Posterior  cuff was made hemostatic  with electrocautery.  All sites were inspected and found to be hemostatic.  The vagina was then closed in a vertical fashion with a running, locking 2-0  Vicryl; with adequate closure.  There was a small amount of bleeding  posteriorly; this was controlled with a figure-of-eight suture of #1  chromic.   The patient was given indigo carmine IV and a 70-degree cystoscope inserted.  The bladder was filled with sterile solution.  The bladder was intact  without injury, and indigo carmine was seen to come from each ureteral  orifice.  The cystoscope was removed and a Foley catheter was placed.  The  patient was taken down from the stirrups.  The patient tolerated the  procedure well and was taken to the recovery room in stable condition.  Counts were correct x2, she  received Ancef 1 g prior to procedure; she had  PAS all throughout the procedure.      TDM/MEDQ  D:  06/22/2004  T:  06/22/2004  Job:  [id number]

## 2010-09-09 NOTE — H&P (Signed)
Shelia Smith, CHARON               ACCOUNT NO.:  [id number]   MEDICAL RECORD NO.:  [id number]          PATIENT TYPE:  OBV   LOCATION:  NA                            FACILITY:  [facility]   PHYSICIAN:  Zenaida Niece, M.D.DATE OF BIRTH:  13-Dec-1955   DATE OF ADMISSION:  06/22/2004  DATE OF DISCHARGE:                                HISTORY & PHYSICAL   CHIEF COMPLAINT:  Symptomatic leiomyomatous uterus.   HISTORY OF PRESENT ILLNESS:  This is a 55 year old black female, gravida 4,  para 3-0-1-3, whom I saw for an annual exam in February of 2005.  At that  time, she had known symptomatic fibroids with menorrhagia, and all medical  and surgical options were discussed with the patient.  Because of recent  increase in bleeding, the patient wishes to proceed with definitive surgical  therapy.  She bled most of January with increasing cramps with her periods.  The patient is admitted for a hysterectomy.   PAST OBSTETRICAL HISTORY:  Significant for one spontaneous abortion and 3  cesarean sections.  One cesarean section was twins, and one of the children  died.   PAST MEDICAL HISTORY:  1.  Significant for hypercholesterolemia.  2.  Anemia.   PAST SURGICAL HISTORY:  1.  Cesarean sections x3.  2.  Hysteroscopy with removal of a submucosal fibroid, and that was      performed in 2002.   ALLERGIES:  None known.   CURRENT MEDICATIONS:  1.  Tricor 145 mg daily.  2.  Slow Fe daily.   SOCIAL HISTORY:  The patient is married and denies alcohol, tobacco, or drug  use.   FAMILY HISTORY:  No GYN or colon cancer.   REVIEW OF SYSTEMS:  She has a history of hematuria and constipation, and had  a UTI in September of 2005, and earlier in February of this year.   PHYSICAL EXAMINATION:  GENERAL:  This is a well-developed, well-nourished  black female in no acute distress.  The last weight in the office was 180  pounds.  NECK:  Supple without lymphadenopathy or thyromegaly.  LUNGS:  Clear to  auscultation.  HEART:  Regular rate and rhythm without murmur.  ABDOMEN:  Soft, nontender, nondistended, without palpable masses, and she  does have a well-healed transverse scar.  EXTREMITIES:  No edema, and are nontender.  PELVIC:  External genitalia is within normal limits without lesions.  Speculum exam shows a normal cervix.  On bimanual exam, the uterus is  slightly enlarged, retroverted, non-tender, and she has no adnexal masses,  and the uterus is fairly mobile.   ASSESSMENT:  Symptomatic leiomyomatous uterus with menorrhagia.  She has  previously had a hysteroscopy with resection of a submucosal fibroid.  She,  after considering all of her options, wants to proceed with definitive  surgical therapy.  Risks of surgery, including bleeding, infection, and  damage to the surrounding organs, have been discussed with the patient.  She  wishes to leave her ovaries, unless they appear abnormal.   PLAN:  Admit the patient on the day of surgery for a vaginal hysterectomy  with cystoscopy.  Possible bilateral salpingo-oophorectomy, if the ovaries  appear abnormal.  The patient understands there is a risk of abdominal  hysterectomy, given the fact that she has had 3 prior cesarean sections.      TDM/MEDQ  D:  06/21/2004  T:  06/21/2004  Job:  [id number]

## 2010-09-09 NOTE — Op Note (Signed)
[facility]  Patient:    Shelia Smith, Shelia Smith                      MRN: [id number] Proc. Date: 07/18/00 Adm. Date:  [id number] Attending:  Michaele Offer                           Operative Report  PREOPERATIVE DIAGNOSES:  Abnormal uterine bleeding and submucosal leiomyomata.  POSTOPERATIVE DIAGNOSES:  Abnormal uterine bleeding and submucosal leiomyomata.  PROCEDURE:  Hysteroscopic resection of two submucosal leiomyomata and dilation and curettage.  SURGEON:  Lavina Hamman, M.D.  ANESTHESIA:  Monitored anesthesia care with paracervical block.  ESTIMATED BLOOD LOSS:  100 cc.  FLUID DEFICIT THROUGH THE HYSTEROSCOPE:  260 cc.  FINDINGS:  A large submucosal myoma in the right lower uterine segment and once this was removed, there was a smaller submucosal myoma in the anterior fundus.  COUNTS:  Correct.  CONDITION:  Stable.  DESCRIPTION OF PROCEDURE:  After appropriate informed consent was obtained, the patient was taken to the operating room and placed in the dorsal supine position. IV sedation was then given and she was placed in mobile stirrups. Paracervical block was then performed after her perineum was prepped and draped in the usual sterile fashion and her bladder drained with a red rubber catheter. The anterior lip of the cervix was grasped with single tooth tenaculum. The cervix easily dilated to a size 31 dilator to allow passage of the resectoscope. Once the resectoscope was inserted, this submucosal myoma in the right lower uterine segment was easily visualized. I took probably an hour to resect this with the resecting loop down to its root. There may have been small segments of this leiomyoma remaining but most of it was removed. Bleeding throughout the procedure was controlled with cautery. Once this myoma was removed, there was seen to be another myoma protruding anteriorly from the fundus. This was likewise removed with  resecting loop without removal of the fibroid and adequate hemostasis. Sharp curettage was performed and all pieces of fibroid were removed and the endometrium was curetted with good uterine cry in all quadrants. Inspection one more time with the hysteroscope revealed adequate hemostasis and no further lesions. The hysteroscope was removed. The single tooth tenaculum was removed and bleeding controlled with pressure. All instruments were then removed from the vagina. The patient was awakened in the operating room, tolerated the procedure well and taken to the recovery room in stable condition. DD:  07/18/00 TD:  07/19/00 Job: [id number]

## 2010-09-28 ENCOUNTER — Other Ambulatory Visit (INDEPENDENT_AMBULATORY_CARE_PROVIDER_SITE_OTHER): Payer: Managed Care, Other (non HMO)

## 2010-09-28 ENCOUNTER — Encounter: Payer: Self-pay | Admitting: Internal Medicine

## 2010-09-28 DIAGNOSIS — T887XXA Unspecified adverse effect of drug or medicament, initial encounter: Secondary | ICD-10-CM

## 2010-09-28 DIAGNOSIS — E785 Hyperlipidemia, unspecified: Secondary | ICD-10-CM

## 2010-09-28 LAB — LIPID PANEL
Cholesterol: 213 mg/dL — ABNORMAL HIGH (ref 0–200)
HDL: 80.6 mg/dL (ref >39.00)
Triglycerides: 79 mg/dL (ref 0.0–149.0)

## 2010-09-28 LAB — LDL CHOLESTEROL, DIRECT: Direct LDL: 128.3 mg/dL

## 2010-09-28 LAB — HEPATIC FUNCTION PANEL
Bilirubin, Direct: 0.1 mg/dL (ref 0.0–0.3)
Total Protein: 7.1 g/dL (ref 6.0–8.3)

## 2010-10-14 ENCOUNTER — Ambulatory Visit: Payer: Self-pay | Admitting: Internal Medicine

## 2010-10-14 ENCOUNTER — Encounter: Payer: Self-pay | Admitting: Internal Medicine

## 2010-10-14 ENCOUNTER — Ambulatory Visit (INDEPENDENT_AMBULATORY_CARE_PROVIDER_SITE_OTHER): Payer: Managed Care, Other (non HMO) | Admitting: Internal Medicine

## 2010-10-14 VITALS — BP 130/80 | HR 72 | Temp 98.2°F | Resp 14 | Ht 65.0 in | Wt 188.0 lb

## 2010-10-14 DIAGNOSIS — E785 Hyperlipidemia, unspecified: Secondary | ICD-10-CM

## 2010-10-14 DIAGNOSIS — R7989 Other specified abnormal findings of blood chemistry: Secondary | ICD-10-CM

## 2010-10-14 NOTE — Progress Notes (Signed)
  Subjective:    Patient ID: Shelia Smith, female    DOB: [birth year], 55 y.o.   MRN: [id number]  HPI This is a 55 year old female with a history of hyperlipidemia.  There is some family history that provides risk factors for cardiovascular disease but the patient has a high HDL it has been consistent.  We've concerned about fatty liver disease and have noted that when she takes a statin more than once a week her liver functions become elevated.  However she has taken control of the situation and has lost 12 pounds and I do believe that we can try new drugs just with using omega-3 supplements artificial supplements and that she will do well with this   Review of Systems  Constitutional: Negative for activity change, appetite change and fatigue.  HENT: Negative for ear pain, congestion, neck pain, postnasal drip and sinus pressure.   Eyes: Negative for redness and visual disturbance.  Respiratory: Negative for cough, shortness of breath and wheezing.   Gastrointestinal: Negative for abdominal pain and abdominal distention.  Genitourinary: Negative for dysuria, frequency and menstrual problem.  Musculoskeletal: Negative for myalgias, joint swelling and arthralgias.  Skin: Negative for rash and wound.  Neurological: Negative for dizziness, weakness and headaches.  Hematological: Negative for adenopathy. Does not bruise/bleed easily.  Psychiatric/Behavioral: Negative for sleep disturbance and decreased concentration.       Objective:   Physical Exam  Constitutional: She is oriented to person, place, and time. She appears well-developed and well-nourished. No distress.  HENT:  Head: Normocephalic and atraumatic.  Right Ear: External ear normal.  Left Ear: External ear normal.  Nose: Nose normal.  Mouth/Throat: Oropharynx is clear and moist.  Eyes: Conjunctivae and EOM are normal. Pupils are equal, round, and reactive to light.  Neck: Normal range of motion. Neck supple. No JVD present.  No tracheal deviation present. No thyromegaly present.  Cardiovascular: Normal rate, regular rhythm, normal heart sounds and intact distal pulses.   No murmur heard. Pulmonary/Chest: Effort normal and breath sounds normal. She has no wheezes. She exhibits no tenderness.  Abdominal: Soft. Bowel sounds are normal.  Musculoskeletal: Normal range of motion. She exhibits no edema and no tenderness.  Lymphadenopathy:    She has no cervical adenopathy.  Neurological: She is alert and oriented to person, place, and time. She has normal reflexes. No cranial nerve deficit.  Skin: Skin is warm and dry. She is not diaphoretic.  Psychiatric: She has a normal mood and affect. Her behavior is normal.          Assessment & Plan:  Plan is to discontinue the Crestor.  We will repeat liver functions and a lipid panel in 4 months I recommended that she consider facial or omega-3 supplementation twice daily otherwise we will not use prescription drugs

## 2010-10-28 ENCOUNTER — Encounter: Payer: Self-pay | Admitting: Internal Medicine

## 2010-12-07 ENCOUNTER — Telehealth: Payer: Self-pay | Admitting: *Deleted

## 2010-12-07 NOTE — Telephone Encounter (Signed)
Pt calls stating she has had spells of epigastric pain x several weeks......she says it feels like her food is sticking, but it not always associated with eating.  Lately the pain has been in her "breast bone", and had nausea and vomiting today.  Then she had diarrhea and some sweating.  Advised that without being evaluated, it was no way to know if she was having esophageal problems or cardiac.  Strongly recommended she go to the ER ASAP to rule out a MI.  Pt agreed.

## 2010-12-08 NOTE — Telephone Encounter (Signed)
Dr Jenkins made aware 

## 2011-02-06 ENCOUNTER — Other Ambulatory Visit (INDEPENDENT_AMBULATORY_CARE_PROVIDER_SITE_OTHER): Payer: Managed Care, Other (non HMO)

## 2011-02-06 ENCOUNTER — Other Ambulatory Visit: Payer: Self-pay | Admitting: Internal Medicine

## 2011-02-06 DIAGNOSIS — E785 Hyperlipidemia, unspecified: Secondary | ICD-10-CM

## 2011-02-06 LAB — HEPATIC FUNCTION PANEL
ALT: 54 U/L — ABNORMAL HIGH (ref 0–35)
Bilirubin, Direct: 0 mg/dL (ref 0.0–0.3)
Total Protein: 7.3 g/dL (ref 6.0–8.3)

## 2011-02-06 LAB — LIPID PANEL
HDL: 84.6 mg/dL (ref >39.00)
Triglycerides: 85 mg/dL (ref 0.0–149.0)

## 2011-02-15 ENCOUNTER — Ambulatory Visit: Payer: Managed Care, Other (non HMO) | Admitting: Internal Medicine

## 2011-02-20 ENCOUNTER — Ambulatory Visit (INDEPENDENT_AMBULATORY_CARE_PROVIDER_SITE_OTHER): Payer: Managed Care, Other (non HMO) | Admitting: Internal Medicine

## 2011-02-20 ENCOUNTER — Encounter: Payer: Self-pay | Admitting: Internal Medicine

## 2011-02-20 VITALS — BP 124/80 | HR 72 | Temp 98.2°F | Resp 16 | Ht 66.0 in | Wt 185.0 lb

## 2011-02-20 DIAGNOSIS — D649 Anemia, unspecified: Secondary | ICD-10-CM

## 2011-02-20 DIAGNOSIS — R7401 Elevation of levels of liver transaminase levels: Secondary | ICD-10-CM

## 2011-02-20 DIAGNOSIS — R748 Abnormal levels of other serum enzymes: Secondary | ICD-10-CM

## 2011-02-20 DIAGNOSIS — E785 Hyperlipidemia, unspecified: Secondary | ICD-10-CM

## 2011-02-20 NOTE — Patient Instructions (Signed)
The patient is instructed to continue all medications as prescribed. Schedule followup with check out clerk upon leaving the clinic  

## 2011-02-21 LAB — HEPATITIS C ANTIBODY: HCV Ab: NEGATIVE

## 2011-02-21 LAB — IRON: Iron: 46 ug/dL (ref 42–145)

## 2011-02-27 NOTE — Progress Notes (Signed)
  Subjective:    Patient ID: Shelia Smith, female    DOB: [birth year], 55 y.o.   MRN: [id number]  HPI Patient is a 55 year old African American female who is followed for hyperlipidemia history of anemia history of fatty liver disease who presents today for routine followup CBC with differential repeat of her anemia cholesterol values are reviewed and discussed with the patient in detail   Review of Systems  Constitutional: Negative for activity change, appetite change and fatigue.  HENT: Negative for ear pain, congestion, neck pain, postnasal drip and sinus pressure.   Eyes: Negative for redness and visual disturbance.  Respiratory: Negative for cough, shortness of breath and wheezing.   Gastrointestinal: Negative for abdominal pain and abdominal distention.  Genitourinary: Negative for dysuria, frequency and menstrual problem.  Musculoskeletal: Negative for myalgias, joint swelling and arthralgias.  Skin: Negative for rash and wound.  Neurological: Negative for dizziness, weakness and headaches.  Hematological: Negative for adenopathy. Does not bruise/bleed easily.  Psychiatric/Behavioral: Negative for sleep disturbance and decreased concentration.   Past Medical History  Diagnosis Date  . Anemia   . Hyperlipidemia    Past Surgical History  Procedure Date  . Abdominal hysterectomy   . Myomectomy     reports that she has quit smoking. She does not have any smokeless tobacco history on file. She reports that she does not drink alcohol or use illicit drugs. family history includes Cancer in an unspecified family member; Diabetes in an unspecified family member; and Hypertension in an unspecified family member. No Known Allergies      Objective:   Physical Exam  Nursing note and vitals reviewed. Constitutional: She is oriented to person, place, and time. She appears well-developed and well-nourished. No distress.  HENT:  Head: Normocephalic and atraumatic.  Right Ear: External  ear normal.  Left Ear: External ear normal.  Nose: Nose normal.  Mouth/Throat: Oropharynx is clear and moist.  Eyes: Conjunctivae and EOM are normal. Pupils are equal, round, and reactive to light.  Neck: Normal range of motion. Neck supple. No JVD present. No tracheal deviation present. No thyromegaly present.  Cardiovascular: Normal rate, regular rhythm, normal heart sounds and intact distal pulses.   No murmur heard. Pulmonary/Chest: Effort normal and breath sounds normal. She has no wheezes. She exhibits no tenderness.  Abdominal: Soft. Bowel sounds are normal.  Musculoskeletal: Normal range of motion. She exhibits no edema and no tenderness.  Lymphadenopathy:    She has no cervical adenopathy.  Neurological: She is alert and oriented to person, place, and time. She has normal reflexes. No cranial nerve deficit.  Skin: Skin is warm and dry. She is not diaphoretic.  Psychiatric: She has a normal mood and affect. Her behavior is normal.          Assessment & Plan:  We discussed elevated liver functions with the presumptive diagnosis of fatty liver disease we will review iron ceruloplasmin hepatitis A and B. to make sure that this diagnosis is appropriate.  We discussed the implications of fatty liver disease long-term.  Her cholesterol management is currently using omega-3 we discussed the role of weight loss and control of her cholesterol.  We reviewed her CBC differential for her chronic anemia

## 2011-05-08 ENCOUNTER — Ambulatory Visit (INDEPENDENT_AMBULATORY_CARE_PROVIDER_SITE_OTHER): Payer: Managed Care, Other (non HMO) | Admitting: Internal Medicine

## 2011-05-08 ENCOUNTER — Encounter: Payer: Self-pay | Admitting: Internal Medicine

## 2011-05-08 DIAGNOSIS — R748 Abnormal levels of other serum enzymes: Secondary | ICD-10-CM

## 2011-05-08 DIAGNOSIS — K760 Fatty (change of) liver, not elsewhere classified: Secondary | ICD-10-CM

## 2011-05-08 DIAGNOSIS — K7689 Other specified diseases of liver: Secondary | ICD-10-CM

## 2011-05-08 LAB — HEPATIC FUNCTION PANEL
AST: 26 U/L (ref 0–37)
Albumin: 4.2 g/dL (ref 3.5–5.2)
Alkaline Phosphatase: 88 U/L (ref 39–117)

## 2011-05-08 NOTE — Progress Notes (Signed)
Subjective:    Patient ID: Shelia Smith, female    DOB: 12-02-1955, 56 y.o.   MRN: [id number]  HPI is a 56 year old white female who presents for followup of elevated liver functions her presumptive fatty liver she had complete workup for alternate causes of elevated liver functions which were negative she does not use alcohol.  She has a long-standing history of hyperlipidemia periodic elevations of her liver functions with weight gain.      Review of Systems  Constitutional: Negative for activity change, appetite change and fatigue.  HENT: Negative for ear pain, congestion, neck pain, postnasal drip and sinus pressure.   Eyes: Negative for redness and visual disturbance.  Respiratory: Negative for cough, shortness of breath and wheezing.   Gastrointestinal: Negative for abdominal pain and abdominal distention.  Genitourinary: Negative for dysuria, frequency and menstrual problem.  Musculoskeletal: Negative for myalgias, joint swelling and arthralgias.  Skin: Negative for rash and wound.  Neurological: Negative for dizziness, weakness and headaches.  Hematological: Negative for adenopathy. Does not bruise/bleed easily.  Psychiatric/Behavioral: Negative for sleep disturbance and decreased concentration.   Past Medical History  Diagnosis Date  . Anemia   . Hyperlipidemia     History   Social History  . Marital Status: Married    Spouse Name: N/A    Number of Children: N/A  . Years of Education: N/A   Occupational History  . Not on file.   Social History Main Topics  . Smoking status: Former Games developer  . Smokeless tobacco: Not on file  . Alcohol Use: No  . Drug Use: No  . Sexually Active: Not on file   Other Topics Concern  . Not on file   Social History Narrative  . No narrative on file    Past Surgical History  Procedure Date  . Abdominal hysterectomy   . Myomectomy     Family History  Problem Relation Age of Onset  . Hypertension    . Diabetes    .  Cancer      No Known Allergies  Current Outpatient Prescriptions on File Prior to Visit  Medication Sig Dispense Refill  . calcium-vitamin D 250-100 MG-UNIT per tablet Take 1 tablet by mouth daily.        Marland Kitchen co-enzyme Q-10 30 MG capsule Take 30 mg by mouth daily.        . Omega-3 Fatty Acids (FISH OIL) 1000 MG CAPS Take by mouth.          BP 120/80  Pulse 76  Temp 98.2 F (36.8 C)  Resp 16  Ht 5' 5.5" (1.664 m)  Wt 180 lb (81.647 kg)  BMI 29.50 kg/m2       Objective:   Physical Exam  Nursing note and vitals reviewed. Constitutional: She is oriented to person, place, and time. She appears well-developed and well-nourished. No distress.  HENT:  Head: Normocephalic and atraumatic.  Right Ear: External ear normal.  Left Ear: External ear normal.  Nose: Nose normal.  Mouth/Throat: Oropharynx is clear and moist.  Eyes: Conjunctivae and EOM are normal. Pupils are equal, round, and reactive to light.  Neck: Normal range of motion. Neck supple. No JVD present. No tracheal deviation present. No thyromegaly present.  Cardiovascular: Normal rate, regular rhythm, normal heart sounds and intact distal pulses.   No murmur heard. Pulmonary/Chest: Effort normal and breath sounds normal. She has no wheezes. She exhibits no tenderness.  Abdominal: Soft. Bowel sounds are normal.  Musculoskeletal: Normal range of motion. She  exhibits no edema and no tenderness.  Lymphadenopathy:    She has no cervical adenopathy.  Neurological: She is alert and oriented to person, place, and time. She has normal reflexes. No cranial nerve deficit.  Skin: Skin is warm and dry. She is not diaphoretic.  Psychiatric: She has a normal mood and affect. Her behavior is normal.          Assessment & Plan:  Monitoring of liver enzymes from fatty liver I am hopeful that with her significant weight loss about 20 pounds her liver enzymes returned to normal she understands that the ultimate goal is to continue to  keep her weight below 180 pounds with a goal of 160 and this should assure that her liver enzymes will be normal for the long-term we'll continue to monitor her

## 2011-05-08 NOTE — Patient Instructions (Signed)
The patient is instructed to continue all medications as prescribed. Schedule followup with check out clerk upon leaving the clinic  

## 2011-10-02 ENCOUNTER — Other Ambulatory Visit (INDEPENDENT_AMBULATORY_CARE_PROVIDER_SITE_OTHER): Payer: Managed Care, Other (non HMO)

## 2011-10-02 DIAGNOSIS — Z0000 Encounter for general adult medical examination without abnormal findings: Secondary | ICD-10-CM

## 2011-10-02 LAB — BASIC METABOLIC PANEL
CO2: 26 mEq/L (ref 19–32)
Chloride: 105 mEq/L (ref 96–112)
GFR: 86.03 mL/min (ref >60.00)
Glucose, Bld: 82 mg/dL (ref 70–99)
Potassium: 4.3 mEq/L (ref 3.5–5.1)
Sodium: 140 mEq/L (ref 135–145)

## 2011-10-02 LAB — POCT URINALYSIS DIPSTICK
Glucose, UA: NEGATIVE
Ketones, UA: NEGATIVE
Protein, UA: NEGATIVE
Spec Grav, UA: 1.015

## 2011-10-02 LAB — CBC WITH DIFFERENTIAL/PLATELET
Basophils Absolute: 0 10*3/uL (ref 0.0–0.1)
HCT: 38.9 % (ref 36.0–46.0)
Hemoglobin: 12.7 g/dL (ref 12.0–15.0)
Lymphs Abs: 1.7 10*3/uL (ref 0.7–4.0)
MCHC: 32.6 g/dL (ref 30.0–36.0)
MCV: 88.4 fl (ref 78.0–100.0)
Monocytes Relative: 7.8 % (ref 3.0–12.0)
Neutro Abs: 1.1 10*3/uL — ABNORMAL LOW (ref 1.4–7.7)
RDW: 13.5 % (ref 11.5–14.6)

## 2011-10-02 LAB — HEPATIC FUNCTION PANEL
AST: 29 U/L (ref 0–37)
Albumin: 3.8 g/dL (ref 3.5–5.2)

## 2011-10-02 LAB — LDL CHOLESTEROL, DIRECT: Direct LDL: 184.4 mg/dL

## 2011-10-02 LAB — TSH: TSH: 1.75 u[IU]/mL (ref 0.35–5.50)

## 2011-10-02 LAB — LIPID PANEL: HDL: 81.8 mg/dL (ref >39.00)

## 2011-10-09 ENCOUNTER — Encounter: Payer: Self-pay | Admitting: Internal Medicine

## 2011-10-09 ENCOUNTER — Ambulatory Visit (INDEPENDENT_AMBULATORY_CARE_PROVIDER_SITE_OTHER): Payer: Managed Care, Other (non HMO) | Admitting: Internal Medicine

## 2011-10-09 VITALS — BP 120/70 | HR 72 | Temp 98.2°F | Resp 14 | Ht 64.5 in | Wt 176.0 lb

## 2011-10-09 DIAGNOSIS — Z0000 Encounter for general adult medical examination without abnormal findings: Secondary | ICD-10-CM

## 2011-10-09 DIAGNOSIS — E785 Hyperlipidemia, unspecified: Secondary | ICD-10-CM

## 2011-10-09 MED ORDER — RED YEAST RICE 600 MG PO CAPS: 1.0000 | ORAL_CAPSULE | Freq: Two times a day (BID) | ORAL | Status: DC

## 2011-10-09 NOTE — Progress Notes (Signed)
Subjective:    Patient ID: Shelia Smith, female    DOB: 1955-07-15, 56 y.o.   MRN: [id number]  HPI Patient presents for complete physical examination.  She has a history of fatty liver and hyperlipidemia she has been on a combination of facial and diet she has lost approximately 20 pounds her liver enzymes have normalized her cholesterol significantly improved.  We tried a statin both daily and a pulse statin but her liver functions were elevated with this and it was impossible to differentiate between true fatty liver disease and statin toxicity.  She did however develop significant myalgias on the medications   Review of Systems  Constitutional: Negative for activity change, appetite change and fatigue.  HENT: Negative for ear pain, congestion, neck pain, postnasal drip and sinus pressure.   Eyes: Negative for redness and visual disturbance.  Respiratory: Negative for cough, shortness of breath and wheezing.   Gastrointestinal: Negative for abdominal pain and abdominal distention.  Genitourinary: Negative for dysuria, frequency and menstrual problem.  Musculoskeletal: Negative for myalgias, joint swelling and arthralgias.  Skin: Negative for rash and wound.  Neurological: Negative for dizziness, weakness and headaches.  Hematological: Negative for adenopathy. Does not bruise/bleed easily.  Psychiatric/Behavioral: Negative for disturbed wake/sleep cycle and decreased concentration.   Past Medical History  Diagnosis Date  . Anemia   . Hyperlipidemia     History   Social History  . Marital Status: Married    Spouse Name: N/A    Number of Children: N/A  . Years of Education: N/A   Occupational History  . Not on file.   Social History Main Topics  . Smoking status: Former Games developer  . Smokeless tobacco: Not on file  . Alcohol Use: No  . Drug Use: No  . Sexually Active: Not on file   Other Topics Concern  . Not on file   Social History Narrative  . No narrative on file     Past Surgical History  Procedure Date  . Abdominal hysterectomy   . Myomectomy     Family History  Problem Relation Age of Onset  . Hypertension    . Diabetes    . Cancer      No Known Allergies  Current Outpatient Prescriptions on File Prior to Visit  Medication Sig Dispense Refill  . calcium-vitamin D 250-100 MG-UNIT per tablet Take 1 tablet by mouth daily.        Marland Kitchen co-enzyme Q-10 30 MG capsule Take 30 mg by mouth daily.        . Omega-3 Fatty Acids (FISH OIL) 1000 MG CAPS Take by mouth.          BP 120/70  Pulse 72  Temp 98.2 F (36.8 C)  Resp 14  Ht 5' 4.5" (1.638 m)  Wt 176 lb (79.833 kg)  BMI 29.74 kg/m2       Objective:   Physical Exam  Nursing note and vitals reviewed. Constitutional: She is oriented to person, place, and time. She appears well-developed and well-nourished. No distress.  HENT:  Head: Normocephalic and atraumatic.  Right Ear: External ear normal.  Left Ear: External ear normal.  Nose: Nose normal.  Mouth/Throat: Oropharynx is clear and moist.  Eyes: Conjunctivae and EOM are normal. Pupils are equal, round, and reactive to light.  Neck: Normal range of motion. Neck supple. No JVD present. No tracheal deviation present. No thyromegaly present.  Cardiovascular: Normal rate, regular rhythm, normal heart sounds and intact distal pulses.   No murmur heard.  Pulmonary/Chest: Effort normal and breath sounds normal. She has no wheezes. She exhibits no tenderness.  Abdominal: Soft. Bowel sounds are normal.  Musculoskeletal: Normal range of motion. She exhibits no edema and no tenderness.  Lymphadenopathy:    She has no cervical adenopathy.  Neurological: She is alert and oriented to person, place, and time. She has normal reflexes. No cranial nerve deficit.  Skin: Skin is warm and dry. She is not diaphoretic.  Psychiatric: She has a normal mood and affect. Her behavior is normal.          Assessment & Plan:   This is a routine physical  examination for this healthy  Female. Reviewed all health maintenance protocols including mammography colonoscopy bone density and reviewed appropriate screening labs. Her immunization history was reviewed as well as her current medications and allergies refills of her chronic medications were given and the plan for yearly health maintenance was discussed all orders and referrals were made as appropriate.  I recommend the addition of omega-3 fatty acids with the red rice yeast twice daily monitoring in 6 months.  Continue course of diet and weight loss liver functions have largely resolved cholesterol was near goal

## 2011-11-02 ENCOUNTER — Encounter: Payer: Self-pay | Admitting: Internal Medicine

## 2012-04-03 ENCOUNTER — Other Ambulatory Visit (INDEPENDENT_AMBULATORY_CARE_PROVIDER_SITE_OTHER): Payer: Managed Care, Other (non HMO)

## 2012-04-03 DIAGNOSIS — E785 Hyperlipidemia, unspecified: Secondary | ICD-10-CM

## 2012-04-03 LAB — LIPID PANEL
Cholesterol: 277 mg/dL — ABNORMAL HIGH (ref 0–200)
HDL: 75.4 mg/dL (ref >39.00)
Triglycerides: 73 mg/dL (ref 0.0–149.0)
VLDL: 14.6 mg/dL (ref 0.0–40.0)

## 2012-04-10 ENCOUNTER — Ambulatory Visit: Payer: Managed Care, Other (non HMO) | Admitting: Internal Medicine

## 2012-05-22 ENCOUNTER — Ambulatory Visit: Payer: Managed Care, Other (non HMO) | Admitting: Internal Medicine

## 2012-05-24 ENCOUNTER — Ambulatory Visit: Payer: Managed Care, Other (non HMO) | Admitting: Internal Medicine

## 2012-09-05 ENCOUNTER — Ambulatory Visit (INDEPENDENT_AMBULATORY_CARE_PROVIDER_SITE_OTHER): Payer: Managed Care, Other (non HMO) | Admitting: Family Medicine

## 2012-09-05 ENCOUNTER — Encounter: Payer: Self-pay | Admitting: Family Medicine

## 2012-09-05 VITALS — BP 104/76 | Temp 97.7°F | Wt 181.0 lb

## 2012-09-05 DIAGNOSIS — M549 Dorsalgia, unspecified: Secondary | ICD-10-CM

## 2012-09-05 NOTE — Patient Instructions (Addendum)
-  400-800mg  of ibuprfen up to 2 times daily or tylenol 500-1000mg  up to 3 times daily  -topical sports cream like capsacin or menthol  -heat 15 minutes twice daily  -home exercises provided - at least 4 days per week: 1) circled exercises for 1 week 2) add other exercises  -follow up with your doctor in 4 weeks

## 2012-09-05 NOTE — Progress Notes (Signed)
Chief Complaint  Patient presents with  . Back Pain    has goteen worse, tingling in right leg down to foot    HPI:  Acute visit for back pain: -has had some back pain on and off for years, had fall down stairs about 20 years ago -has had a flare recently -pain is in low back, sometimes one side, sometimes the other, intermittent achy pain -no radiation, occ numbness post R leg - has had flare of this for 3 days -worse with: exercise -better with: ibuprofen helps, heat sometimes helps -denies: weight loss, fevers, chills, bowel or bladder incontinence  ROS: See pertinent positives and negatives per HPI.  Past Medical History  Diagnosis Date  . Anemia   . Hyperlipidemia     Family History  Problem Relation Age of Onset  . Hypertension    . Diabetes    . Cancer      History   Social History  . Marital Status: Married    Spouse Name: N/A    Number of Children: N/A  . Years of Education: N/A   Social History Main Topics  . Smoking status: Former Games developer  . Smokeless tobacco: None  . Alcohol Use: No  . Drug Use: No  . Sexually Active: None   Other Topics Concern  . None   Social History Narrative  . None    Current outpatient prescriptions:calcium-vitamin D 250-100 MG-UNIT per tablet, Take 1 tablet by mouth daily.  , Disp: , Rfl: ;  co-enzyme Q-10 30 MG capsule, Take 30 mg by mouth daily.  , Disp: , Rfl: ;  Omega-3 Fatty Acids (FISH OIL) 1000 MG CAPS, Take by mouth.  , Disp: , Rfl: ;  Red Yeast Rice 600 MG CAPS, Take 1 capsule (600 mg total) by mouth 2 (two) times daily., Disp: , Rfl:   EXAM:  Filed Vitals:   09/05/12 1359  BP: 104/76  Temp: 97.7 F (36.5 C)    Body mass index is 30.6 kg/(m^2).  GENERAL: vitals reviewed and listed above, alert, oriented, appears well hydrated and in no acute distress  HEENT: atraumatic, conjunttiva clear, no obvious abnormalities on inspection of external nose and ears  NECK: no obvious masses on inspection  MS:  moves all extremities without noticeable abnormality Normal Gait Normal inspection of back, no obvious scoliosis or leg length descrepancy No bony TTP Soft tissue TTP at: bilat lumbar paraspinal muscles and PSIS bilat -/+ tests: neg trendelenburg,+facet loading on the R, -SLRT, -CLRT, -FABER, -FADIR Normal muscle strength, sensation to light touch and DTRs in LEs bilaterally  PSYCH: pleasant and cooperative, no obvious depression or anxiety  ASSESSMENT AND PLAN:  Discussed the following assessment and plan:  Back pain  -benign exam, suspect some DDD, discussed options including referral, conservative treatment, PT - she would like to try HEP, conservative care with close follow up with PCP -Patient advised to return or notify a doctor immediately if symptoms worsen or persist or new concerns arise.  Patient Instructions  -400-800mg  of ibuprfen up to 2 times daily or tylenol 500-1000mg  up to 3 times daily  -topical sports cream like capsacin or menthol  -heat 15 minutes twice daily  -home exercises provided - at least 4 days per week: 1) circled exercises for 1 week 2) add other exercises  -follow up with your doctor in 4 weeks     Alyla Pietila, Dahlia Client R.

## 2012-11-08 ENCOUNTER — Encounter: Payer: Self-pay | Admitting: Internal Medicine

## 2013-02-27 ENCOUNTER — Other Ambulatory Visit: Payer: Self-pay

## 2013-04-05 ENCOUNTER — Ambulatory Visit (INDEPENDENT_AMBULATORY_CARE_PROVIDER_SITE_OTHER): Payer: Managed Care, Other (non HMO) | Admitting: Emergency Medicine

## 2013-04-05 VITALS — BP 110/70 | HR 76 | Temp 98.0°F | Resp 16 | Ht 64.0 in | Wt 190.0 lb

## 2013-04-05 DIAGNOSIS — M5430 Sciatica, unspecified side: Secondary | ICD-10-CM

## 2013-04-05 DIAGNOSIS — M5431 Sciatica, right side: Secondary | ICD-10-CM

## 2013-04-05 DIAGNOSIS — S335XXA Sprain of ligaments of lumbar spine, initial encounter: Secondary | ICD-10-CM

## 2013-04-05 MED ORDER — NAPROXEN SODIUM 550 MG PO TABS: 550.0000 mg | ORAL_TABLET | Freq: Two times a day (BID) | ORAL | Status: AC

## 2013-04-05 MED ORDER — TRAMADOL HCL 50 MG PO TABS: 50.0000 mg | ORAL_TABLET | Freq: Three times a day (TID) | ORAL | Status: DC | PRN

## 2013-04-05 MED ORDER — CYCLOBENZAPRINE HCL 10 MG PO TABS: 10.0000 mg | ORAL_TABLET | Freq: Three times a day (TID) | ORAL | Status: DC | PRN

## 2013-04-05 NOTE — Patient Instructions (Signed)

## 2013-04-05 NOTE — Progress Notes (Signed)
[facility] [address] [place] [address] [phone]  Date:  04/05/2013   Name:  Shelia Smith   DOB:  1955/08/31   MRN:  [id number]  PCP:  Carrie Mew, MD    Chief Complaint: Back Pain and Hip Pain   History of Present Illness:  Shelia Smith is a 57 y.o. very pleasant female patient who presents with the following:  History of low back pain since doing the "inchworm" in workout 2 weeks ago.  No history of direct injury.  Intermittent paresthesias in right calf but no radiation of pain past hip.  No weakness.  Has history of chronic back pain. No improvement with over the counter medications or other home remedies. Denies other complaint or health concern today.   Patient Active Problem List   Diagnosis Date Noted  . ABDOMINAL OR PELVIC SWELLING MASS OR LUMP RUQ 01/26/2010  . FATTY LIVER DISEASE 07/19/2009  . SUBACROMIAL BURSITIS, RIGHT 07/19/2009  . OSTEOPENIA 06/22/2008  . HYPERLIPIDEMIA 02/25/2007  . ANEMIA-NOS 02/25/2007    Past Medical History  Diagnosis Date  . Anemia   . Hyperlipidemia     Past Surgical History  Procedure Laterality Date  . Abdominal hysterectomy    . Myomectomy      History  Substance Use Topics  . Smoking status: Former Games developer  . Smokeless tobacco: Not on file  . Alcohol Use: No    Family History  Problem Relation Age of Onset  . Hypertension    . Diabetes    . Cancer      Allergies  Allergen Reactions  . Statins     Elevated LFTS    Medication list has been reviewed and updated.  Current Outpatient Prescriptions on File Prior to Visit  Medication Sig Dispense Refill  . calcium-vitamin D 250-100 MG-UNIT per tablet Take 1 tablet by mouth daily.        Marland Kitchen co-enzyme Q-10 30 MG capsule Take 30 mg by mouth daily.        . Omega-3 Fatty Acids (FISH OIL) 1000 MG CAPS Take by mouth.        . Red Yeast Rice 600 MG CAPS Take 1 capsule (600 mg total) by mouth 2 (two) times daily.       No current  facility-administered medications on file prior to visit.    Review of Systems:  As per HPI, otherwise negative.    Physical Examination: Filed Vitals:   04/05/13 0951  BP: 110/70  Pulse: 76  Temp: 98 F (36.7 C)  Resp: 16   Filed Vitals:   04/05/13 0951  Height: 5\' 4"  (1.626 m)  Weight: 190 lb (86.183 kg)   Body mass index is 32.6 kg/(m^2). Ideal Body Weight: Weight in (lb) to have BMI = 25: 145.3   GEN: WDWN, NAD, Non-toxic, Alert & Oriented x 3 HEENT: Atraumatic, Normocephalic.  Ears and Nose: No external deformity. EXTR: No clubbing/cyanosis/edema NEURO: Normal gait. Neuro intact.  Good muscle strength. PSYCH: Normally interactive. Conversant. Not depressed or anxious appearing.  Calm demeanor.  BACK:  Tender bilaterally sacral area.  Tender right sciatic notch  Assessment and Plan: Lumbar strain Anaprox Flexeril Ultram  Signed,  Phillips Odor, MD

## 2013-07-21 ENCOUNTER — Encounter: Payer: Self-pay | Admitting: Family

## 2013-07-21 ENCOUNTER — Ambulatory Visit (INDEPENDENT_AMBULATORY_CARE_PROVIDER_SITE_OTHER)
Admission: RE | Admit: 2013-07-21 | Discharge: 2013-07-21 | Disposition: A | Discharge status: Home / Self Care | Payer: Managed Care, Other (non HMO) | Source: Ambulatory Visit | Attending: Family | Admitting: Family

## 2013-07-21 ENCOUNTER — Ambulatory Visit (INDEPENDENT_AMBULATORY_CARE_PROVIDER_SITE_OTHER): Payer: Managed Care, Other (non HMO) | Admitting: Family

## 2013-07-21 VITALS — BP 120/80 | HR 78 | Wt 191.0 lb

## 2013-07-21 DIAGNOSIS — M47816 Spondylosis without myelopathy or radiculopathy, lumbar region: Secondary | ICD-10-CM

## 2013-07-21 DIAGNOSIS — M545 Low back pain, unspecified: Secondary | ICD-10-CM

## 2013-07-21 DIAGNOSIS — G8929 Other chronic pain: Secondary | ICD-10-CM

## 2013-07-21 DIAGNOSIS — M47817 Spondylosis without myelopathy or radiculopathy, lumbosacral region: Secondary | ICD-10-CM

## 2013-07-21 MED ORDER — TRAMADOL HCL 50 MG PO TABS: 50.0000 mg | ORAL_TABLET | Freq: Three times a day (TID) | ORAL | Status: DC | PRN

## 2013-07-21 MED ORDER — CYCLOBENZAPRINE HCL 10 MG PO TABS: 10.0000 mg | ORAL_TABLET | Freq: Three times a day (TID) | ORAL | Status: DC | PRN

## 2013-07-21 NOTE — Progress Notes (Signed)
Pre visit review using our clinic review tool, if applicable. No additional management support is needed unless otherwise documented below in the visit note. 

## 2013-07-21 NOTE — Progress Notes (Signed)
   Subjective:    Patient ID: Shelia Smith, female    DOB: 12/18/1955, 58 y.o.   MRN: [id number]  HPI 58 year old AA female, nonsmoker presenting for lower back pain.  Pain present x 5 years.  Over the last year symptoms has been getting. worse. When she was in her 20's she fell down a flight of stairs.  At that time, she saw a chiropractor. The back pain has been interrupting her activities such as exercise, walking and riding a bike.  She has used heat, tramadol, Naproxen, and Flexeril.  Pain seems to come and go with tingling in right leg at times   Review of Systems  Constitutional: Positive for activity change.       Interrupts her activities such as walking, exercise, and riding a bike  Respiratory: Negative.   Cardiovascular: Negative.   Gastrointestinal: Negative.   Genitourinary: Negative.   Musculoskeletal: Positive for back pain and gait problem. Negative for joint swelling and neck pain.       C/o back pain and problems with walking  Skin: Negative.        Objective:   Physical Exam  Constitutional: She is oriented to person, place, and time. She appears well-developed and well-nourished.  Neck: Normal range of motion. Neck supple.  Cardiovascular: Normal rate, regular rhythm and normal heart sounds.   Pulmonary/Chest: Effort normal and breath sounds normal.  Abdominal: Soft. Bowel sounds are normal.  Musculoskeletal:  Back pain and problems with walking  Neurological: She is alert and oriented to person, place, and time. Coordination normal.  Tingling right leg at times  Skin: Skin is warm and dry.          Assessment & Plan:  Assessment 1. Chronic Back Pain  Plan 1. Flexeril 10 mg TID/PRN. 2.Tramadolo 50 mg. 3. Encourage back exercises. 4. Referral for Xray at [facility]. 5.  Contact office for questions or concerns.

## 2013-07-21 NOTE — Patient Instructions (Signed)
Back Exercises These exercises may help you when beginning to rehabilitate your injury. Your symptoms may resolve with or without further involvement from your physician, physical therapist or athletic trainer. While completing these exercises, remember:   Restoring tissue flexibility helps normal motion to return to the joints. This allows healthier, less painful movement and activity.  An effective stretch should be held for at least 30 seconds.  A stretch should never be painful. You should only feel a gentle lengthening or release in the stretched tissue. STRETCH  Extension, Prone on Elbows   Lie on your stomach on the floor, a bed will be too soft. Place your palms about shoulder width apart and at the height of your head.  Place your elbows under your shoulders. If this is too painful, stack pillows under your chest.  Allow your body to relax so that your hips drop lower and make contact more completely with the floor.  Hold this position for __________ seconds.  Slowly return to lying flat on the floor. Repeat __________ times. Complete this exercise __________ times per day.  RANGE OF MOTION  Extension, Prone Press Ups   Lie on your stomach on the floor, a bed will be too soft. Place your palms about shoulder width apart and at the height of your head.  Keeping your back as relaxed as possible, slowly straighten your elbows while keeping your hips on the floor. You may adjust the placement of your hands to maximize your comfort. As you gain motion, your hands will come more underneath your shoulders.  Hold this position __________ seconds.  Slowly return to lying flat on the floor. Repeat __________ times. Complete this exercise __________ times per day.  RANGE OF MOTION- Quadruped, Neutral Spine   Assume a hands and knees position on a firm surface. Keep your hands under your shoulders and your knees under your hips. You may place padding under your knees for comfort.  Drop  your head and point your tail bone toward the ground below you. This will round out your low back like an angry cat. Hold this position for __________ seconds.  Slowly lift your head and release your tail bone so that your back sags into a large arch, like an old horse.  Hold this position for __________ seconds.  Repeat this until you feel limber in your low back.  Now, find your "sweet spot." This will be the most comfortable position somewhere between the two previous positions. This is your neutral spine. Once you have found this position, tense your stomach muscles to support your low back.  Hold this position for __________ seconds. Repeat __________ times. Complete this exercise __________ times per day.  STRETCH  Flexion, Single Knee to Chest   Lie on a firm bed or floor with both legs extended in front of you.  Keeping one leg in contact with the floor, bring your opposite knee to your chest. Hold your leg in place by either grabbing behind your thigh or at your knee.  Pull until you feel a gentle stretch in your low back. Hold __________ seconds.  Slowly release your grasp and repeat the exercise with the opposite side. Repeat __________ times. Complete this exercise __________ times per day.  STRETCH - Hamstrings, Standing  Stand or sit and extend your right / left leg, placing your foot on a chair or foot stool  Keeping a slight arch in your low back and your hips straight forward.  Lead with your chest and   lean forward at the waist until you feel a gentle stretch in the back of your right / left knee or thigh. (When done correctly, this exercise requires leaning only a small distance.)  Hold this position for __________ seconds. Repeat __________ times. Complete this stretch __________ times per day. STRENGTHENING  Deep Abdominals, Pelvic Tilt   Lie on a firm bed or floor. Keeping your legs in front of you, bend your knees so they are both pointed toward the ceiling and  your feet are flat on the floor.  Tense your lower abdominal muscles to press your low back into the floor. This motion will rotate your pelvis so that your tail bone is scooping upwards rather than pointing at your feet or into the floor.  With a gentle tension and even breathing, hold this position for __________ seconds. Repeat __________ times. Complete this exercise __________ times per day.  STRENGTHENING  Abdominals, Crunches   Lie on a firm bed or floor. Keeping your legs in front of you, bend your knees so they are both pointed toward the ceiling and your feet are flat on the floor. Cross your arms over your chest.  Slightly tip your chin down without bending your neck.  Tense your abdominals and slowly lift your trunk high enough to just clear your shoulder blades. Lifting higher can put excessive stress on the low back and does not further strengthen your abdominal muscles.  Control your return to the starting position. Repeat __________ times. Complete this exercise __________ times per day.  STRENGTHENING  Quadruped, Opposite UE/LE Lift   Assume a hands and knees position on a firm surface. Keep your hands under your shoulders and your knees under your hips. You may place padding under your knees for comfort.  Find your neutral spine and gently tense your abdominal muscles so that you can maintain this position. Your shoulders and hips should form a rectangle that is parallel with the floor and is not twisted.  Keeping your trunk steady, lift your right hand no higher than your shoulder and then your left leg no higher than your hip. Make sure you are not holding your breath. Hold this position __________ seconds.  Continuing to keep your abdominal muscles tense and your back steady, slowly return to your starting position. Repeat with the opposite arm and leg. Repeat __________ times. Complete this exercise __________ times per day. Document Released: 04/28/2005 Document  Revised: 07/03/2011 Document Reviewed: 07/23/2008 [employer] Patient Information 2014 [employer].  

## 2013-07-22 ENCOUNTER — Encounter: Payer: Self-pay | Admitting: Family

## 2013-07-22 ENCOUNTER — Telehealth: Payer: Self-pay

## 2013-07-22 MED ORDER — MELOXICAM 15 MG PO TABS: 15.0000 mg | ORAL_TABLET | Freq: Every day | ORAL | Status: DC

## 2013-07-22 NOTE — Telephone Encounter (Signed)
Message copied by Beverely LowFRAZIER, Keiri Solano L on Tue Jul 22, 2013 12:58 PM ------      Message from: Adline MangoAMPBELL, PADONDA B      Created: Tue Jul 22, 2013  9:04 AM       Arthritis present in the spine as expected. Take Mobic once a day. ------

## 2013-07-22 NOTE — Telephone Encounter (Signed)
Pt aware. Mobic was not sent to pharmacy, however. Pt did get Rx for flexeril and tramadol. You suggested mobic for arthritis. Ok to send?

## 2013-10-14 ENCOUNTER — Encounter: Payer: Self-pay | Admitting: Gastroenterology

## 2013-11-11 ENCOUNTER — Encounter: Payer: Self-pay | Admitting: Internal Medicine

## 2014-01-17 ENCOUNTER — Encounter: Payer: Self-pay | Admitting: Gastroenterology

## 2014-02-13 ENCOUNTER — Other Ambulatory Visit (INDEPENDENT_AMBULATORY_CARE_PROVIDER_SITE_OTHER): Payer: Managed Care, Other (non HMO)

## 2014-02-13 DIAGNOSIS — Z0000 Encounter for general adult medical examination without abnormal findings: Secondary | ICD-10-CM

## 2014-02-13 LAB — POCT URINALYSIS DIPSTICK
Bilirubin, UA: NEGATIVE
GLUCOSE UA: NEGATIVE
KETONES UA: NEGATIVE
Leukocytes, UA: NEGATIVE
NITRITE UA: NEGATIVE
Protein, UA: NEGATIVE
SPEC GRAV UA: 1.015
Urobilinogen, UA: 0.2
pH, UA: 6.5

## 2014-02-13 LAB — BASIC METABOLIC PANEL
BUN: 10 mg/dL (ref 6–23)
CHLORIDE: 107 meq/L (ref 96–112)
CO2: 23 mEq/L (ref 19–32)
Calcium: 9.4 mg/dL (ref 8.4–10.5)
Creatinine, Ser: 0.8 mg/dL (ref 0.4–1.2)
GFR: 91.24 mL/min (ref >60.00)
Glucose, Bld: 92 mg/dL (ref 70–99)
POTASSIUM: 4.4 meq/L (ref 3.5–5.1)
Sodium: 141 mEq/L (ref 135–145)

## 2014-02-13 LAB — LIPID PANEL
Cholesterol: 278 mg/dL — ABNORMAL HIGH (ref 0–200)
HDL: 81 mg/dL (ref >39.00)
LDL Cholesterol: 177 mg/dL — ABNORMAL HIGH (ref 0–99)
NonHDL: 197
TRIGLYCERIDES: 102 mg/dL (ref 0.0–149.0)
Total CHOL/HDL Ratio: 3
VLDL: 20.4 mg/dL (ref 0.0–40.0)

## 2014-02-13 LAB — CBC WITH DIFFERENTIAL/PLATELET
BASOS PCT: 0.5 % (ref 0.0–3.0)
Basophils Absolute: 0 10*3/uL (ref 0.0–0.1)
EOS PCT: 2 % (ref 0.0–5.0)
Eosinophils Absolute: 0.1 10*3/uL (ref 0.0–0.7)
HEMATOCRIT: 41.1 % (ref 36.0–46.0)
HEMOGLOBIN: 13.3 g/dL (ref 12.0–15.0)
LYMPHS ABS: 2 10*3/uL (ref 0.7–4.0)
Lymphocytes Relative: 49.5 % — ABNORMAL HIGH (ref 12.0–46.0)
MCHC: 32.3 g/dL (ref 30.0–36.0)
MCV: 87.7 fl (ref 78.0–100.0)
MONO ABS: 0.2 10*3/uL (ref 0.1–1.0)
Monocytes Relative: 5.3 % (ref 3.0–12.0)
NEUTROS ABS: 1.7 10*3/uL (ref 1.4–7.7)
NEUTROS PCT: 42.7 % — AB (ref 43.0–77.0)
Platelets: 242 10*3/uL (ref 150.0–400.0)
RBC: 4.69 Mil/uL (ref 3.87–5.11)
RDW: 13.5 % (ref 11.5–15.5)
WBC: 4.1 10*3/uL (ref 4.0–10.5)

## 2014-02-13 LAB — HEPATIC FUNCTION PANEL
ALK PHOS: 90 U/L (ref 39–117)
ALT: 64 U/L — ABNORMAL HIGH (ref 0–35)
AST: 54 U/L — AB (ref 0–37)
Albumin: 3.4 g/dL — ABNORMAL LOW (ref 3.5–5.2)
BILIRUBIN DIRECT: 0 mg/dL (ref 0.0–0.3)
BILIRUBIN TOTAL: 0.6 mg/dL (ref 0.2–1.2)
Total Protein: 7.2 g/dL (ref 6.0–8.3)

## 2014-02-13 LAB — TSH: TSH: 1.72 u[IU]/mL (ref 0.35–4.50)

## 2014-02-20 ENCOUNTER — Encounter: Payer: Managed Care, Other (non HMO) | Admitting: Internal Medicine

## 2014-02-20 ENCOUNTER — Telehealth: Payer: Self-pay | Admitting: Family

## 2014-02-20 ENCOUNTER — Encounter: Payer: Self-pay | Admitting: Family

## 2014-02-20 ENCOUNTER — Ambulatory Visit (INDEPENDENT_AMBULATORY_CARE_PROVIDER_SITE_OTHER)
Admission: RE | Admit: 2014-02-20 | Discharge: 2014-02-20 | Disposition: A | Discharge status: Home / Self Care | Payer: Managed Care, Other (non HMO) | Source: Ambulatory Visit | Attending: Family | Admitting: Family

## 2014-02-20 ENCOUNTER — Ambulatory Visit (INDEPENDENT_AMBULATORY_CARE_PROVIDER_SITE_OTHER): Payer: Managed Care, Other (non HMO) | Admitting: Family

## 2014-02-20 VITALS — BP 118/70 | HR 94 | Ht 64.0 in | Wt 192.0 lb

## 2014-02-20 DIAGNOSIS — M898X9 Other specified disorders of bone, unspecified site: Secondary | ICD-10-CM

## 2014-02-20 DIAGNOSIS — Z0000 Encounter for general adult medical examination without abnormal findings: Secondary | ICD-10-CM

## 2014-02-20 DIAGNOSIS — E7800 Pure hypercholesterolemia, unspecified: Secondary | ICD-10-CM | POA: Insufficient documentation

## 2014-02-20 NOTE — Progress Notes (Signed)
Pre visit review using our clinic review tool, if applicable. No additional management support is needed unless otherwise documented below in the visit note. 

## 2014-02-20 NOTE — Telephone Encounter (Signed)
lmovm to call back and schedule a visit with Padonda the week of 09/02/14 for a fup

## 2014-02-20 NOTE — Progress Notes (Signed)
Subjective:    Patient ID: Shelia KyleSylvia Smith, female    DOB: 10/03/1955, 58 y.o.   MRN: [id number]  HPI 58 year old African-American female, nonsmoker with a history of hyperlipidemia with a high HDL. Has a family history significant for hypercholesterolemia. She has statin intolerant. Exercises daily and try to follow a healthy diet. Colonoscopy due in 2020. Had mammogram done this year and that was normal. LFTs were elevated in the past and currently had a normal abdominal ultrasound.   Review of Systems  Constitutional: Negative.   HENT: Negative.   Eyes: Negative.   Respiratory: Negative.   Cardiovascular: Negative.   Gastrointestinal: Negative.   Endocrine: Negative.   Genitourinary: Negative.   Musculoskeletal: Negative.   Skin: Negative.   Allergic/Immunologic: Negative.   Neurological: Negative.   Hematological: Negative.   Psychiatric/Behavioral: Negative.    Past Medical History  Diagnosis Date  . Anemia   . Hyperlipidemia     History   Social History  . Marital Status: Married    Spouse Name: N/A    Number of Children: N/A  . Years of Education: N/A   Occupational History  . Not on file.   Social History Main Topics  . Smoking status: Former Games developermoker  . Smokeless tobacco: Not on file  . Alcohol Use: No  . Drug Use: No  . Sexual Activity: Not on file   Other Topics Concern  . Not on file   Social History Narrative  . No narrative on file    Past Surgical History  Procedure Laterality Date  . Abdominal hysterectomy    . Myomectomy      Family History  Problem Relation Age of Onset  . Hypertension    . Diabetes    . Cancer      Allergies  Allergen Reactions  . Statins     Elevated LFTS    Current Outpatient Prescriptions on File Prior to Visit  Medication Sig Dispense Refill  . calcium-vitamin D 250-100 MG-UNIT per tablet Take 1 tablet by mouth daily.        Marland Kitchen. co-enzyme Q-10 30 MG capsule Take 30 mg by mouth daily.        .  cyclobenzaprine (FLEXERIL) 10 MG tablet Take 1 tablet (10 mg total) by mouth 3 (three) times daily as needed for muscle spasms.  60 tablet  2  . meloxicam (MOBIC) 15 MG tablet Take 1 tablet (15 mg total) by mouth daily.  30 tablet  3  . naproxen sodium (ANAPROX DS) 550 MG tablet Take 1 tablet (550 mg total) by mouth 2 (two) times daily with a meal.  40 tablet  0  . Omega-3 Fatty Acids (FISH OIL) 1000 MG CAPS Take by mouth.        . traMADol (ULTRAM) 50 MG tablet Take 1 tablet (50 mg total) by mouth every 8 (eight) hours as needed.  60 tablet  2   No current facility-administered medications on file prior to visit.    BP 118/70  Pulse 94  Ht 5\' 4"  (1.626 m)  Wt 192 lb (87.091 kg)  BMI 32.94 kg/m2chart    Objective:   Physical Exam  Constitutional: She is oriented to person, place, and time. She appears well-developed and well-nourished.  HENT:  Right Ear: External ear normal.  Left Ear: External ear normal.  Nose: Nose normal.  Mouth/Throat: Oropharynx is clear and moist.  Eyes: Conjunctivae and EOM are normal. Pupils are equal, round, and reactive to light.  Neck:  Normal range of motion. Neck supple. No thyromegaly present.  Cardiovascular: Normal rate, regular rhythm and normal heart sounds.   Pulmonary/Chest: Effort normal and breath sounds normal.  Abdominal: Soft. Bowel sounds are normal.  Musculoskeletal: Normal range of motion.  Neurological: She is alert and oriented to person, place, and time. She has normal reflexes. She displays normal reflexes. No cranial nerve deficit. Coordination normal.  Skin: Skin is warm and dry.  Psychiatric: She has a normal mood and affect.          Assessment & Plan:  Shelia Smith was seen today for annual exam.  Diagnoses and associated orders for this visit:  Preventative health care - EKG 12-Lead  Bony prominence - DG Cervical Spine 2 or 3 views; Future  Pure hypercholesterolemia    called the office with any questions or  concerns. Encouraged healthy diet, exercise, monthly self breast exams. Recheck cholesterol fasting in 6 months and sooner as needed.

## 2014-02-20 NOTE — Patient Instructions (Signed)
Fat and Cholesterol Control Diet Fat and cholesterol levels in your blood and organs are influenced by your diet. High levels of fat and cholesterol may lead to diseases of the heart, small and large blood vessels, gallbladder, liver, and pancreas. CONTROLLING FAT AND CHOLESTEROL WITH DIET Although exercise and lifestyle factors are important, your diet is key. That is because certain foods are known to raise cholesterol and others to lower it. The goal is to balance foods for their effect on cholesterol and more importantly, to replace saturated and trans fat with other types of fat, such as monounsaturated fat, polyunsaturated fat, and omega-3 fatty acids. On average, a person should consume no more than 15 to 17 g of saturated fat daily. Saturated and trans fats are considered "bad" fats, and they will raise LDL cholesterol. Saturated fats are primarily found in animal products such as meats, butter, and cream. However, that does not mean you need to give up all your favorite foods. Today, there are good tasting, low-fat, low-cholesterol substitutes for most of the things you like to eat. Choose low-fat or nonfat alternatives. Choose round or loin cuts of red meat. These types of cuts are lowest in fat and cholesterol. Chicken (without the skin), fish, veal, and ground turkey breast are great choices. Eliminate fatty meats, such as hot dogs and salami. Even shellfish have little or no saturated fat. Have a 3 oz (85 g) portion when you eat lean meat, poultry, or fish. Trans fats are also called "partially hydrogenated oils." They are oils that have been scientifically manipulated so that they are solid at room temperature resulting in a longer shelf life and improved taste and texture of foods in which they are added. Trans fats are found in stick margarine, some tub margarines, cookies, crackers, and baked goods.  When baking and cooking, oils are a great substitute for butter. The monounsaturated oils are  especially beneficial since it is believed they lower LDL and raise HDL. The oils you should avoid entirely are saturated tropical oils, such as coconut and palm.  Remember to eat a lot from food groups that are naturally free of saturated and trans fat, including fish, fruit, vegetables, beans, grains (barley, rice, couscous, bulgur wheat), and pasta (without cream sauces).  IDENTIFYING FOODS THAT LOWER FAT AND CHOLESTEROL  Soluble fiber may lower your cholesterol. This type of fiber is found in fruits such as apples, vegetables such as broccoli, potatoes, and carrots, legumes such as beans, peas, and lentils, and grains such as barley. Foods fortified with plant sterols (phytosterol) may also lower cholesterol. You should eat at least 2 g per day of these foods for a cholesterol lowering effect.  Read package labels to identify low-saturated fats, trans fat free, and low-fat foods at the supermarket. Select cheeses that have only 2 to 3 g saturated fat per ounce. Use a heart-healthy tub margarine that is free of trans fats or partially hydrogenated oil. When buying baked goods (cookies, crackers), avoid partially hydrogenated oils. Breads and muffins should be made from whole grains (whole-wheat or whole oat flour, instead of "flour" or "enriched flour"). Buy non-creamy canned soups with reduced salt and no added fats.  FOOD PREPARATION TECHNIQUES  Never deep-fry. If you must fry, either stir-fry, which uses very little fat, or use non-stick cooking sprays. When possible, broil, bake, or roast meats, and steam vegetables. Instead of putting butter or margarine on vegetables, use lemon and herbs, applesauce, and cinnamon (for squash and sweet potatoes). Use nonfat   yogurt, salsa, and low-fat dressings for salads.  LOW-SATURATED FAT / LOW-FAT FOOD SUBSTITUTES Meats / Saturated Fat (g)  Avoid: Steak, marbled (3 oz/85 g) / 11 g  Choose: Steak, lean (3 oz/85 g) / 4 g  Avoid: Hamburger (3 oz/85 g) / 7  g  Choose: Hamburger, lean (3 oz/85 g) / 5 g  Avoid: Ham (3 oz/85 g) / 6 g  Choose: Ham, lean cut (3 oz/85 g) / 2.4 g  Avoid: Chicken, with skin, dark meat (3 oz/85 g) / 4 g  Choose: Chicken, skin removed, dark meat (3 oz/85 g) / 2 g  Avoid: Chicken, with skin, light meat (3 oz/85 g) / 2.5 g  Choose: Chicken, skin removed, light meat (3 oz/85 g) / 1 g Dairy / Saturated Fat (g)  Avoid: Whole milk (1 cup) / 5 g  Choose: Low-fat milk, 2% (1 cup) / 3 g  Choose: Low-fat milk, 1% (1 cup) / 1.5 g  Choose: Skim milk (1 cup) / 0.3 g  Avoid: Hard cheese (1 oz/28 g) / 6 g  Choose: Skim milk cheese (1 oz/28 g) / 2 to 3 g  Avoid: Cottage cheese, 4% fat (1 cup) / 6.5 g  Choose: Low-fat cottage cheese, 1% fat (1 cup) / 1.5 g  Avoid: Ice cream (1 cup) / 9 g  Choose: Sherbet (1 cup) / 2.5 g  Choose: Nonfat frozen yogurt (1 cup) / 0.3 g  Choose: Frozen fruit bar / trace  Avoid: Whipped cream (1 tbs) / 3.5 g  Choose: Nondairy whipped topping (1 tbs) / 1 g Condiments / Saturated Fat (g)  Avoid: Mayonnaise (1 tbs) / 2 g  Choose: Low-fat mayonnaise (1 tbs) / 1 g  Avoid: Butter (1 tbs) / 7 g  Choose: Extra light margarine (1 tbs) / 1 g  Avoid: Coconut oil (1 tbs) / 11.8 g  Choose: Olive oil (1 tbs) / 1.8 g  Choose: Corn oil (1 tbs) / 1.7 g  Choose: Safflower oil (1 tbs) / 1.2 g  Choose: Sunflower oil (1 tbs) / 1.4 g  Choose: Soybean oil (1 tbs) / 2.4 g  Choose: Canola oil (1 tbs) / 1 g Document Released: 04/10/2005 Document Revised: 08/05/2012 Document Reviewed: 07/09/2013 [employer] Patient Information 2015 [employer]. This information is not intended to replace advice given to you by your health care provider. Make sure you discuss any questions you have with your health care provider.  

## 2014-03-20 ENCOUNTER — Encounter: Payer: Self-pay | Admitting: Family

## 2014-08-24 ENCOUNTER — Other Ambulatory Visit (INDEPENDENT_AMBULATORY_CARE_PROVIDER_SITE_OTHER): Payer: Managed Care, Other (non HMO)

## 2014-08-24 DIAGNOSIS — E785 Hyperlipidemia, unspecified: Secondary | ICD-10-CM

## 2014-08-24 LAB — LIPID PANEL
CHOL/HDL RATIO: 4
CHOLESTEROL: 285 mg/dL — AB (ref 0–200)
HDL: 75.3 mg/dL (ref >39.00)
LDL Cholesterol: 187 mg/dL — ABNORMAL HIGH (ref 0–99)
NonHDL: 209.7
Triglycerides: 115 mg/dL (ref 0.0–149.0)
VLDL: 23 mg/dL (ref 0.0–40.0)

## 2014-08-31 ENCOUNTER — Ambulatory Visit: Payer: Managed Care, Other (non HMO) | Admitting: Family

## 2014-09-02 ENCOUNTER — Encounter: Payer: Self-pay | Admitting: Family

## 2014-09-02 ENCOUNTER — Ambulatory Visit (INDEPENDENT_AMBULATORY_CARE_PROVIDER_SITE_OTHER): Payer: Managed Care, Other (non HMO) | Admitting: Family

## 2014-09-02 VITALS — BP 122/84 | HR 88 | Temp 98.2°F | Wt 192.1 lb

## 2014-09-02 DIAGNOSIS — E7800 Pure hypercholesterolemia, unspecified: Secondary | ICD-10-CM

## 2014-09-02 NOTE — Progress Notes (Signed)
Pre visit review using our clinic review tool, if applicable. No additional management support is needed unless otherwise documented below in the visit note. 

## 2014-09-02 NOTE — Progress Notes (Signed)
   Subjective:    Patient ID: Shelia KyleSylvia Smith, female    DOB: 02/07/1956, [age] y.o.   MRN: [id number]  HPI 59 year old African-American female, nonsmoker is in today. With a history of hypercholesterolemia. Not currently taking any medication. Exercises daily.   Review of Systems  Constitutional: Negative.   Respiratory: Negative.   Cardiovascular: Negative.   Gastrointestinal: Negative.   Endocrine: Negative.   Genitourinary: Negative.   Musculoskeletal: Negative.   Skin: Negative.   Neurological: Negative.   Hematological: Negative.   Psychiatric/Behavioral: Negative.    Past Medical History  Diagnosis Date  . Anemia   . Hyperlipidemia     History   Social History  . Marital Status: Married    Spouse Name: N/A  . Number of Children: N/A  . Years of Education: N/A   Occupational History  . Not on file.   Social History Main Topics  . Smoking status: Former Games developermoker  . Smokeless tobacco: Not on file  . Alcohol Use: No  . Drug Use: No  . Sexual Activity: Not on file   Other Topics Concern  . Not on file   Social History Narrative    Past Surgical History  Procedure Laterality Date  . Abdominal hysterectomy    . Myomectomy      Family History  Problem Relation Age of Onset  . Hypertension    . Diabetes    . Cancer      Allergies  Allergen Reactions  . Statins     Elevated LFTS    Current Outpatient Prescriptions on File Prior to Visit  Medication Sig Dispense Refill  . calcium-vitamin D 250-100 MG-UNIT per tablet Take 1 tablet by mouth daily.      Marland Kitchen. co-enzyme Q-10 30 MG capsule Take 30 mg by mouth daily.      . cyclobenzaprine (FLEXERIL) 10 MG tablet Take 1 tablet (10 mg total) by mouth 3 (three) times daily as needed for muscle spasms. 60 tablet 2  . Omega-3 Fatty Acids (FISH OIL) 1000 MG CAPS Take by mouth.       No current facility-administered medications on file prior to visit.    BP 122/84 mmHg  Pulse 88  Temp(Src) 98.2 F (36.8 C) (Oral)   Wt 192 lb 1.6 oz (87.136 kg)chart    Objective:   Physical Exam  Constitutional: She is oriented to person, place, and time. She appears well-developed and well-nourished.  HENT:  Right Ear: External ear normal.  Left Ear: External ear normal.  Nose: Nose normal.  Mouth/Throat: Oropharynx is clear and moist.  Neck: Normal range of motion. Neck supple.  Cardiovascular: Normal rate, regular rhythm and normal heart sounds.   Pulmonary/Chest: Effort normal and breath sounds normal.  Abdominal: Soft. Bowel sounds are normal.  Musculoskeletal: Normal range of motion. She exhibits no edema or tenderness.  Neurological: She is alert and oriented to person, place, and time. She has normal reflexes. She displays normal reflexes. No cranial nerve deficit. Coordination normal.  Skin: Skin is warm and dry.  Psychiatric: She has a normal mood and affect.          Assessment & Plan:  Shelia Smith was seen today for follow-up.  Diagnoses and all orders for this visit:  Pure hypercholesterolemia Orders: -     Lipid Panel   Discussed ATP Risk and patient verbalizes understanding and declined medication intervention at this time. Recheck in 6 months.

## 2014-09-02 NOTE — Patient Instructions (Signed)
Fat and Cholesterol Control Diet Fat and cholesterol levels in your blood and organs are influenced by your diet. High levels of fat and cholesterol may lead to diseases of the heart, small and large blood vessels, gallbladder, liver, and pancreas. CONTROLLING FAT AND CHOLESTEROL WITH DIET Although exercise and lifestyle factors are important, your diet is key. That is because certain foods are known to raise cholesterol and others to lower it. The goal is to balance foods for their effect on cholesterol and more importantly, to replace saturated and trans fat with other types of fat, such as monounsaturated fat, polyunsaturated fat, and omega-3 fatty acids. On average, a person should consume no more than 15 to 17 g of saturated fat daily. Saturated and trans fats are considered "bad" fats, and they will raise LDL cholesterol. Saturated fats are primarily found in animal products such as meats, butter, and cream. However, that does not mean you need to give up all your favorite foods. Today, there are good tasting, low-fat, low-cholesterol substitutes for most of the things you like to eat. Choose low-fat or nonfat alternatives. Choose round or loin cuts of red meat. These types of cuts are lowest in fat and cholesterol. Chicken (without the skin), fish, veal, and ground turkey breast are great choices. Eliminate fatty meats, such as hot dogs and salami. Even shellfish have little or no saturated fat. Have a 3 oz (85 g) portion when you eat lean meat, poultry, or fish. Trans fats are also called "partially hydrogenated oils." They are oils that have been scientifically manipulated so that they are solid at room temperature resulting in a longer shelf life and improved taste and texture of foods in which they are added. Trans fats are found in stick margarine, some tub margarines, cookies, crackers, and baked goods.  When baking and cooking, oils are a great substitute for butter. The monounsaturated oils are  especially beneficial since it is believed they lower LDL and raise HDL. The oils you should avoid entirely are saturated tropical oils, such as coconut and palm.  Remember to eat a lot from food groups that are naturally free of saturated and trans fat, including fish, fruit, vegetables, beans, grains (barley, rice, couscous, bulgur wheat), and pasta (without cream sauces).  IDENTIFYING FOODS THAT LOWER FAT AND CHOLESTEROL  Soluble fiber may lower your cholesterol. This type of fiber is found in fruits such as apples, vegetables such as broccoli, potatoes, and carrots, legumes such as beans, peas, and lentils, and grains such as barley. Foods fortified with plant sterols (phytosterol) may also lower cholesterol. You should eat at least 2 g per day of these foods for a cholesterol lowering effect.  Read package labels to identify low-saturated fats, trans fat free, and low-fat foods at the supermarket. Select cheeses that have only 2 to 3 g saturated fat per ounce. Use a heart-healthy tub margarine that is free of trans fats or partially hydrogenated oil. When buying baked goods (cookies, crackers), avoid partially hydrogenated oils. Breads and muffins should be made from whole grains (whole-wheat or whole oat flour, instead of "flour" or "enriched flour"). Buy non-creamy canned soups with reduced salt and no added fats.  FOOD PREPARATION TECHNIQUES  Never deep-fry. If you must fry, either stir-fry, which uses very little fat, or use non-stick cooking sprays. When possible, broil, bake, or roast meats, and steam vegetables. Instead of putting butter or margarine on vegetables, use lemon and herbs, applesauce, and cinnamon (for squash and sweet potatoes). Use nonfat   yogurt, salsa, and low-fat dressings for salads.  LOW-SATURATED FAT / LOW-FAT FOOD SUBSTITUTES Meats / Saturated Fat (g)  Avoid: Steak, marbled (3 oz/85 g) / 11 g  Choose: Steak, lean (3 oz/85 g) / 4 g  Avoid: Hamburger (3 oz/85 g) / 7  g  Choose: Hamburger, lean (3 oz/85 g) / 5 g  Avoid: Ham (3 oz/85 g) / 6 g  Choose: Ham, lean cut (3 oz/85 g) / 2.4 g  Avoid: Chicken, with skin, dark meat (3 oz/85 g) / 4 g  Choose: Chicken, skin removed, dark meat (3 oz/85 g) / 2 g  Avoid: Chicken, with skin, light meat (3 oz/85 g) / 2.5 g  Choose: Chicken, skin removed, light meat (3 oz/85 g) / 1 g Dairy / Saturated Fat (g)  Avoid: Whole milk (1 cup) / 5 g  Choose: Low-fat milk, 2% (1 cup) / 3 g  Choose: Low-fat milk, 1% (1 cup) / 1.5 g  Choose: Skim milk (1 cup) / 0.3 g  Avoid: Hard cheese (1 oz/28 g) / 6 g  Choose: Skim milk cheese (1 oz/28 g) / 2 to 3 g  Avoid: Cottage cheese, 4% fat (1 cup) / 6.5 g  Choose: Low-fat cottage cheese, 1% fat (1 cup) / 1.5 g  Avoid: Ice cream (1 cup) / 9 g  Choose: Sherbet (1 cup) / 2.5 g  Choose: Nonfat frozen yogurt (1 cup) / 0.3 g  Choose: Frozen fruit bar / trace  Avoid: Whipped cream (1 tbs) / 3.5 g  Choose: Nondairy whipped topping (1 tbs) / 1 g Condiments / Saturated Fat (g)  Avoid: Mayonnaise (1 tbs) / 2 g  Choose: Low-fat mayonnaise (1 tbs) / 1 g  Avoid: Butter (1 tbs) / 7 g  Choose: Extra light margarine (1 tbs) / 1 g  Avoid: Coconut oil (1 tbs) / 11.8 g  Choose: Olive oil (1 tbs) / 1.8 g  Choose: Corn oil (1 tbs) / 1.7 g  Choose: Safflower oil (1 tbs) / 1.2 g  Choose: Sunflower oil (1 tbs) / 1.4 g  Choose: Soybean oil (1 tbs) / 2.4 g  Choose: Canola oil (1 tbs) / 1 g Document Released: 04/10/2005 Document Revised: 08/05/2012 Document Reviewed: 07/09/2013 [employer] Patient Information 2015 [employer]. This information is not intended to replace advice given to you by your health care provider. Make sure you discuss any questions you have with your health care provider.  

## 2014-11-02 LAB — HM MAMMOGRAPHY: HM MAMMO: NEGATIVE

## 2014-11-06 ENCOUNTER — Encounter: Payer: Self-pay | Admitting: Family

## 2015-02-03 LAB — BASIC METABOLIC PANEL: Glucose: 94 mg/dL

## 2015-02-03 LAB — LIPID PANEL
CHOLESTEROL: 273 mg/dL — AB (ref 0–200)
HDL: 83 mg/dL — AB (ref 35–70)
LDL Cholesterol: 174 mg/dL
Triglycerides: 79 mg/dL (ref 40–160)

## 2015-03-05 ENCOUNTER — Ambulatory Visit: Payer: Managed Care, Other (non HMO) | Admitting: Adult Health

## 2015-03-22 ENCOUNTER — Ambulatory Visit (INDEPENDENT_AMBULATORY_CARE_PROVIDER_SITE_OTHER): Payer: Managed Care, Other (non HMO) | Admitting: Family Medicine

## 2015-03-22 VITALS — BP 110/68 | HR 75 | Temp 97.7°F | Resp 18 | Ht 65.0 in | Wt 198.0 lb

## 2015-03-22 DIAGNOSIS — R109 Unspecified abdominal pain: Secondary | ICD-10-CM

## 2015-03-22 LAB — POC MICROSCOPIC URINALYSIS (UMFC): Mucus: ABSENT

## 2015-03-22 LAB — POCT URINALYSIS DIP (MANUAL ENTRY)
Bilirubin, UA: NEGATIVE
Blood, UA: NEGATIVE
Glucose, UA: NEGATIVE
Ketones, POC UA: NEGATIVE
Leukocytes, UA: NEGATIVE
Nitrite, UA: NEGATIVE
Protein Ur, POC: NEGATIVE
Spec Grav, UA: 1.02
Urobilinogen, UA: 0.2
pH, UA: 7

## 2015-03-22 MED ORDER — DICLOFENAC SODIUM 75 MG PO TBEC: 75.0000 mg | DELAYED_RELEASE_TABLET | Freq: Two times a day (BID) | ORAL | Status: DC

## 2015-03-22 NOTE — Progress Notes (Signed)
 @[facility] @  This chart was scribed for Shelia SidleKurt Hennessy Bartel, MD by Shelia Smith, ED Scribe. This patient was seen in room 2 and the patient's care was started at 4:52 PM.  Patient ID: Shelia Smith MRN: [id number], DOB: 03/01/1956, 59 y.o. Date of Encounter: 03/22/2015, 4:52 PM  Primary Physician: Shelia FosterPadonda B Webb, FNP  Chief Complaint:  Chief Complaint  Patient presents with  . Back Pain    sharp pain in lower back     HPI: 59 y.o. year old female with history below presents with intermittent, sharp, low back pain that began 2 days ago. No injury or fall. Pt has pain only with movement, such as twisting. She's had problems with low back pain, about 3 years, but states this pain is different. She exercises twice a week but does not remember injury her back. She's taken 3 doses of left over flexeril over the past 2 days without relief to . Pain does not wake her at night. She denies bladder or bowel incontinence and nausea.    Past Medical History  Diagnosis Date  . Anemia   . Hyperlipidemia      Home Meds: Prior to Admission medications   Medication Sig Start Date End Date Taking? Authorizing Provider  calcium-vitamin D 250-100 MG-UNIT per tablet Take 1 tablet by mouth daily.     Yes Historical Provider, MD  co-enzyme Q-10 30 MG capsule Take 30 mg by mouth daily.     Yes Historical Provider, MD  cyclobenzaprine (FLEXERIL) 10 MG tablet Take 1 tablet (10 mg total) by mouth 3 (three) times daily as needed for muscle spasms. 07/21/13  Yes Shelia FosterPadonda B Webb, FNP  Omega-3 Fatty Acids (FISH OIL) 1000 MG CAPS Take by mouth.     Yes Historical Provider, MD    Allergies:  Allergies  Allergen Reactions  . Statins     Elevated LFTS    Social History   Social History  . Marital Status: Married    Spouse Name: N/A  . Number of Children: N/A  . Years of Education: N/A   Occupational History  . Not on file.   Social History Main Topics  . Smoking status: Former Games developermoker  . Smokeless tobacco:  Not on file  . Alcohol Use: No  . Drug Use: No  . Sexual Activity: Not on file   Other Topics Concern  . Not on file   Social History Narrative    Review of Systems: Constitutional: negative for chills, fever, night sweats, weight changes, or fatigue  HEENT: negative for vision changes, hearing loss, congestion, rhinorrhea, ST, epistaxis, or sinus pressure Cardiovascular: negative for chest pain or palpitations Respiratory: negative for hemoptysis, wheezing, shortness of breath, or cough Abdominal: negative for abdominal pain, nausea, vomiting, diarrhea, or constipation Dermatological: negative for rash Neurologic: negative for headache, dizziness, or syncope All other systems reviewed and are otherwise negative with the exception to those above and in the HPI.   Physical Exam: Blood pressure 110/68, pulse 75, temperature 97.7 F (36.5 C), temperature source Oral, resp. rate 18, height 5\' 5"  (1.651 m), weight 198 lb (89.812 kg), SpO2 97 %., Body mass index is 32.95 kg/(m^2). General: Well developed, well nourished, in no acute distress. Head: Normocephalic, atraumatic, eyes without discharge, sclera non-icteric, nares are without discharge. Bilateral auditory canals clear, TM's are without perforation, pearly grey. Oral cavity moist. Neck: Supple. No thyromegaly. Full ROM. No lymphadenopathy. Lungs: Clear bilaterally to auscultation without wheezes, rales, or rhonchi. Breathing is unlabored. Heart: RRR with  S1 S2. No murmurs, rubs, or gallops appreciated. Msk:  Strength and tone normal for age. Skin is normal, non tender, full ROM without pain. Straight leg raise is negative Extremities/Skin: Warm and dry. No rashes Neuro: Alert and oriented X 3. Moves all extremities spontaneously. Gait is normal. CNII-XII grossly in tact. Psych:  Responds to questions appropriately with a normal affect.   Labs: Results for orders placed or performed in visit on 03/22/15  POCT urinalysis dipstick   Result Value Ref Range   Color, UA yellow yellow   Clarity, UA clear clear   Glucose, UA negative negative   Bilirubin, UA negative negative   Ketones, POC UA negative negative   Spec Grav, UA 1.020    Blood, UA negative negative   pH, UA 7.0    Protein Ur, POC negative negative   Urobilinogen, UA 0.2    Nitrite, UA Negative Negative   Leukocytes, UA Negative Negative  POCT Microscopic Urinalysis ([facility])  Result Value Ref Range   WBC,UR,HPF,POC None None WBC/hpf   RBC,UR,HPF,POC None None RBC/hpf   Bacteria None None, Too numerous to count   Mucus Absent Absent   Epithelial Cells, UR Per Microscopy Few (A) None, Too numerous to count cells/hpf      ASSESSMENT AND PLAN:  59 y.o. year old female with     ICD-9-CM ICD-10-CM   1. Left flank pain 789.09 R10.9 POCT urinalysis dipstick     POCT Microscopic Urinalysis ([facility])     diclofenac (VOLTAREN) 75 MG EC tablet   By signing my name below, I, Shelia Smith, attest that this documentation has been prepared under the direction and in the presence of Shelia Sidle, MD.  Electronically Signed: Andrew Au, ED Scribe. 03/22/2015. 4:52 PM.  Signed, Shelia Sidle, MD 03/22/2015 4:52 PM

## 2015-03-22 NOTE — Patient Instructions (Signed)
The urine test shows no sign of kidney problem or bladder problem. Therefore we are prescribing an anti-inflammatory to help with muscle strain. He should be better in 48 hours.

## 2015-03-24 ENCOUNTER — Encounter: Payer: Self-pay | Admitting: Adult Health

## 2015-03-24 ENCOUNTER — Ambulatory Visit (INDEPENDENT_AMBULATORY_CARE_PROVIDER_SITE_OTHER): Payer: Managed Care, Other (non HMO) | Admitting: Adult Health

## 2015-03-24 VITALS — BP 120/80 | Temp 98.1°F | Ht 65.0 in | Wt 194.6 lb

## 2015-03-24 DIAGNOSIS — Z0000 Encounter for general adult medical examination without abnormal findings: Secondary | ICD-10-CM

## 2015-03-24 LAB — CBC WITH DIFFERENTIAL/PLATELET
Basophils Absolute: 0 10*3/uL (ref 0.0–0.1)
Basophils Relative: 0.4 % (ref 0.0–3.0)
EOS PCT: 1.3 % (ref 0.0–5.0)
Eosinophils Absolute: 0.1 10*3/uL (ref 0.0–0.7)
HEMATOCRIT: 43.6 % (ref 36.0–46.0)
HEMOGLOBIN: 14.3 g/dL (ref 12.0–15.0)
LYMPHS PCT: 45 % (ref 12.0–46.0)
Lymphs Abs: 1.9 10*3/uL (ref 0.7–4.0)
MCHC: 32.7 g/dL (ref 30.0–36.0)
MCV: 87.5 fl (ref 78.0–100.0)
MONOS PCT: 6.7 % (ref 3.0–12.0)
Monocytes Absolute: 0.3 10*3/uL (ref 0.1–1.0)
Neutro Abs: 2 10*3/uL (ref 1.4–7.7)
Neutrophils Relative %: 46.6 % (ref 43.0–77.0)
Platelets: 255 10*3/uL (ref 150.0–400.0)
RBC: 4.99 Mil/uL (ref 3.87–5.11)
RDW: 13.9 % (ref 11.5–15.5)
WBC: 4.3 10*3/uL (ref 4.0–10.5)

## 2015-03-24 LAB — HEPATIC FUNCTION PANEL
ALBUMIN: 4.2 g/dL (ref 3.5–5.2)
ALK PHOS: 85 U/L (ref 39–117)
ALT: 42 U/L — ABNORMAL HIGH (ref 0–35)
AST: 42 U/L — AB (ref 0–37)
Bilirubin, Direct: 0.1 mg/dL (ref 0.0–0.3)
Total Bilirubin: 0.4 mg/dL (ref 0.2–1.2)
Total Protein: 6.9 g/dL (ref 6.0–8.3)

## 2015-03-24 LAB — BASIC METABOLIC PANEL
BUN: 11 mg/dL (ref 6–23)
CO2: 30 mEq/L (ref 19–32)
Calcium: 9.5 mg/dL (ref 8.4–10.5)
Chloride: 105 mEq/L (ref 96–112)
Creatinine, Ser: 0.89 mg/dL (ref 0.40–1.20)
GFR: 83.85 mL/min (ref >60.00)
Glucose, Bld: 86 mg/dL (ref 70–99)
POTASSIUM: 5.3 meq/L — AB (ref 3.5–5.1)
SODIUM: 141 meq/L (ref 135–145)

## 2015-03-24 LAB — HEMOGLOBIN A1C: HEMOGLOBIN A1C: 6.1 % (ref 4.6–6.5)

## 2015-03-24 LAB — TSH: TSH: 1.5 u[IU]/mL (ref 0.35–4.50)

## 2015-03-24 NOTE — Progress Notes (Signed)
HPI:  Shelia Smith is here to establish care and for her complete physical. She is a healthy AA female who  has a past medical history of Anemia and Hyperlipidemia.  Last PCP and physical: October 2015 Immunizations: UTD -  Does not want flu Diet: Eats healthy - fruits, vegetables Exercise:  Walks, hikes, bikes Colonoscopy: Due in 2020 Mammogram:Due in 2018  Has GYN exam in a few weeks.   Has the following chronic problems that require follow up and concerns today:  She has no concerns today.    ROS negative for unless reported above: fevers, chills,feeling poorly, unintentional weight loss, hearing or vision loss, chest pain, palpitations, leg claudication, struggling to breath,Not feeling congested in the chest, no orthopenia, no cough,no wheezing, normal appetite, no soft tissue swelling, no hemoptysis, melena, hematochezia, hematuria, falls, loc, si, or thoughts of self harm.    Past Medical History  Diagnosis Date  . Anemia   . Hyperlipidemia     Past Surgical History  Procedure Laterality Date  . Abdominal hysterectomy    . Myomectomy      Family History  Problem Relation Age of Onset  . Hypertension    . Diabetes    . Cancer      Social History   Social History  . Marital Status: Married    Spouse Name: N/A  . Number of Children: N/A  . Years of Education: N/A   Social History Main Topics  . Smoking status: Former Games developer  . Smokeless tobacco: None  . Alcohol Use: No  . Drug Use: No  . Sexual Activity: Not Asked   Other Topics Concern  . None   Social History Narrative     Current outpatient prescriptions:  .  calcium-vitamin D 250-100 MG-UNIT per tablet, Take 1 tablet by mouth daily.  , Disp: , Rfl:  .  co-enzyme Q-10 30 MG capsule, Take 30 mg by mouth daily.  , Disp: , Rfl:  .  cyclobenzaprine (FLEXERIL) 10 MG tablet, Take 1 tablet (10 mg total) by mouth 3 (three) times daily as needed for muscle spasms., Disp: 60 tablet, Rfl: 2 .   diclofenac (VOLTAREN) 75 MG EC tablet, Take 1 tablet (75 mg total) by mouth 2 (two) times daily., Disp: 30 tablet, Rfl: 0 .  Omega-3 Fatty Acids (FISH OIL) 1000 MG CAPS, Take by mouth.  , Disp: , Rfl:   EXAM:  Filed Vitals:   03/24/15 1357  BP: 120/80  Temp: 98.1 F (36.7 C)    Body mass index is 32.38 kg/(m^2).  GENERAL: vitals reviewed and listed above, alert, oriented, appears well hydrated and in no acute distress  HEENT: atraumatic, conjunttiva clear, no obvious abnormalities on inspection of external nose and ears  NECK: Neck is soft and supple without masses, no adenopathy or thyromegaly, trachea midline, no JVD. Normal range of motion.   LUNGS: clear to auscultation bilaterally, no wheezes, rales or rhonchi, good air movement  CV: Regular rate and rhythm, normal S1/S2, no audible murmurs, gallops, or rubs. No carotid bruit and no peripheral edema.   MS: moves all extremities without noticeable abnormality. No edema noted  Abd: soft/nontender/nondistended/normal bowel sounds. Slightly obese around abdomen.   Skin: warm and dry, no rash   Extremities: No clubbing, cyanosis, or edema. Capillary refill is WNL. Pulses intact bilaterally in upper and lower extremities.   Neuro: CN II-XII intact, sensation and reflexes normal throughout, 5/5 muscle strength in bilateral upper and lower extremities. Normal finger to  nose. Normal rapid alternating movements. Normal romberg. No pronator drift.   PSYCH: pleasant and cooperative, no obvious depression or anxiety  ASSESSMENT AND PLAN:  1. Routine general medical examination at a health care facility - Basic metabolic panel - CBC with Differential/Platelet - Hemoglobin A1c - Hepatic function panel - TSH - She recently had a lipid panel done at work and she will fax us the results.  - Follow up in one year for CPE - Follow up sooner if needed - She understands that she needs to lose weight and she is determined to do it.       Discussed the following assessment and plan:  -We reviewed the PMH, PSH, FH, SH, Meds and Allergies. -We provided refills for any medications we will prescribe as needed. -We addressed current concerns per orders and patient instructions. -We have asked for records for pertinent exams, studies, vaccines and notes from previous providers. -We have advised patient to follow up per instructions below.   -Patient advised to return or notify a provider immediately if symptoms worsen or persist or new concerns arise.    Shirline Freesory Margaret Staggs, AGNP

## 2015-03-24 NOTE — Progress Notes (Signed)
Pre visit review using our clinic review tool, if applicable. No additional management support is needed unless otherwise documented below in the visit note. 

## 2015-03-24 NOTE — Patient Instructions (Addendum)
It was great meeting you today!  I will follow up with you regarding your blood work.   Continue to eat healthy and exercise.   If you need anything, please do not hesitate to let me know.   Health Maintenance, Female Adopting a healthy lifestyle and getting preventive care can go a long way to promote health and wellness. Talk with your health care provider about what schedule of regular examinations is right for you. This is a good chance for you to check in with your provider about disease prevention and staying healthy. In between checkups, there are plenty of things you can do on your own. Experts have done a lot of research about which lifestyle changes and preventive measures are most likely to keep you healthy. Ask your health care provider for more information. WEIGHT AND DIET  Eat a healthy diet  Be sure to include plenty of vegetables, fruits, low-fat dairy products, and lean protein.  Do not eat a lot of foods high in solid fats, added sugars, or salt.  Get regular exercise. This is one of the most important things you can do for your health.  Most adults should exercise for at least 150 minutes each week. The exercise should increase your heart rate and make you sweat (moderate-intensity exercise).  Most adults should also do strengthening exercises at least twice a week. This is in addition to the moderate-intensity exercise.  Maintain a healthy weight  Body mass index (BMI) is a measurement that can be used to identify possible weight problems. It estimates body fat based on height and weight. Your health care provider can help determine your BMI and help you achieve or maintain a healthy weight.  For females 73 years of age and older:   A BMI below 18.5 is considered underweight.  A BMI of 18.5 to 24.9 is normal.  A BMI of 25 to 29.9 is considered overweight.  A BMI of 30 and above is considered obese.  Watch levels of cholesterol and blood lipids  You should  start having your blood tested for lipids and cholesterol at 57 years of age, then have this test every 5 years.  You may need to have your cholesterol levels checked more often if:  Your lipid or cholesterol levels are high.  You are older than 57 years of age.  You are at high risk for heart disease.  CANCER SCREENING   Lung Cancer  Lung cancer screening is recommended for adults 33-63 years old who are at high risk for lung cancer because of a history of smoking.  A yearly low-dose CT scan of the lungs is recommended for people who:  Currently smoke.  Have quit within the past 15 years.  Have at least a 30-pack-year history of smoking. A pack year is smoking an average of one pack of cigarettes a day for 1 year.  Yearly screening should continue until it has been 15 years since you quit.  Yearly screening should stop if you develop a health problem that would prevent you from having lung cancer treatment.  Breast Cancer  Practice breast self-awareness. This means understanding how your breasts normally appear and feel.  It also means doing regular breast self-exams. Let your health care provider know about any changes, no matter how small.  If you are in your 20s or 30s, you should have a clinical breast exam (CBE) by a health care provider every 1-3 years as part of a regular health exam.  If  you are 40 or older, have a CBE every year. Also consider having a breast X-ray (mammogram) every year.  If you have a family history of breast cancer, talk to your health care provider about genetic screening.  If you are at high risk for breast cancer, talk to your health care provider about having an MRI and a mammogram every year.  Breast cancer gene (BRCA) assessment is recommended for women who have family members with BRCA-related cancers. BRCA-related cancers include:  Breast.  Ovarian.  Tubal.  Peritoneal cancers.  Results of the assessment will determine the  need for genetic counseling and BRCA1 and BRCA2 testing. Cervical Cancer Your health care provider may recommend that you be screened regularly for cancer of the pelvic organs (ovaries, uterus, and vagina). This screening involves a pelvic examination, including checking for microscopic changes to the surface of your cervix (Pap test). You may be encouraged to have this screening done every 3 years, beginning at age 1.  For women ages 54-65, health care providers may recommend pelvic exams and Pap testing every 3 years, or they may recommend the Pap and pelvic exam, combined with testing for human papilloma virus (HPV), every 5 years. Some types of HPV increase your risk of cervical cancer. Testing for HPV may also be done on women of any age with unclear Pap test results.  Other health care providers may not recommend any screening for nonpregnant women who are considered low risk for pelvic cancer and who do not have symptoms. Ask your health care provider if a screening pelvic exam is right for you.  If you have had past treatment for cervical cancer or a condition that could lead to cancer, you need Pap tests and screening for cancer for at least 20 years after your treatment. If Pap tests have been discontinued, your risk factors (such as having a new sexual partner) need to be reassessed to determine if screening should resume. Some women have medical problems that increase the chance of getting cervical cancer. In these cases, your health care provider may recommend more frequent screening and Pap tests. Colorectal Cancer  This type of cancer can be detected and often prevented.  Routine colorectal cancer screening usually begins at 57 years of age and continues through 57 years of age.  Your health care provider may recommend screening at an earlier age if you have risk factors for colon cancer.  Your health care provider may also recommend using home test kits to check for hidden blood in  the stool.  A small camera at the end of a tube can be used to examine your colon directly (sigmoidoscopy or colonoscopy). This is done to check for the earliest forms of colorectal cancer.  Routine screening usually begins at age 56.  Direct examination of the colon should be repeated every 5-10 years through 57 years of age. However, you may need to be screened more often if early forms of precancerous polyps or small growths are found. Skin Cancer  Check your skin from head to toe regularly.  Tell your health care provider about any new moles or changes in moles, especially if there is a change in a mole's shape or color.  Also tell your health care provider if you have a mole that is larger than the size of a pencil eraser.  Always use sunscreen. Apply sunscreen liberally and repeatedly throughout the day.  Protect yourself by wearing long sleeves, pants, a wide-brimmed hat, and sunglasses whenever you are  outside. HEART DISEASE, DIABETES, AND HIGH BLOOD PRESSURE   High blood pressure causes heart disease and increases the risk of stroke. High blood pressure is more likely to develop in:  People who have blood pressure in the high end of the normal range (130-139/85-89 mm Hg).  People who are overweight or obese.  People who are African American.  If you are 18-39 years of age, have your blood pressure checked every 3-5 years. If you are 40 years of age or older, have your blood pressure checked every year. You should have your blood pressure measured twice--once when you are at a hospital or clinic, and once when you are not at a hospital or clinic. Record the average of the two measurements. To check your blood pressure when you are not at a hospital or clinic, you can use:  An automated blood pressure machine at a pharmacy.  A home blood pressure monitor.  If you are between 55 years and 79 years old, ask your health care provider if you should take aspirin to prevent  strokes.  Have regular diabetes screenings. This involves taking a blood sample to check your fasting blood sugar level.  If you are at a normal weight and have a low risk for diabetes, have this test once every three years after 57 years of age.  If you are overweight and have a high risk for diabetes, consider being tested at a younger age or more often. PREVENTING INFECTION  Hepatitis B  If you have a higher risk for hepatitis B, you should be screened for this virus. You are considered at high risk for hepatitis B if:  You were born in a country where hepatitis B is common. Ask your health care provider which countries are considered high risk.  Your parents were born in a high-risk country, and you have not been immunized against hepatitis B (hepatitis B vaccine).  You have HIV or AIDS.  You use needles to inject street drugs.  You live with someone who has hepatitis B.  You have had sex with someone who has hepatitis B.  You get hemodialysis treatment.  You take certain medicines for conditions, including cancer, organ transplantation, and autoimmune conditions. Hepatitis C  Blood testing is recommended for:  Everyone born from 1945 through 1965.  Anyone with known risk factors for hepatitis C. Sexually transmitted infections (STIs)  You should be screened for sexually transmitted infections (STIs) including gonorrhea and chlamydia if:  You are sexually active and are younger than 57 years of age.  You are older than 57 years of age and your health care provider tells you that you are at risk for this type of infection.  Your sexual activity has changed since you were last screened and you are at an increased risk for chlamydia or gonorrhea. Ask your health care provider if you are at risk.  If you do not have HIV, but are at risk, it may be recommended that you take a prescription medicine daily to prevent HIV infection. This is called pre-exposure prophylaxis  (PrEP). You are considered at risk if:  You are sexually active and do not regularly use condoms or know the HIV status of your partner(s).  You take drugs by injection.  You are sexually active with a partner who has HIV. Talk with your health care provider about whether you are at high risk of being infected with HIV. If you choose to begin PrEP, you should first be tested for HIV.   You should then be tested every 3 months for as long as you are taking PrEP.  PREGNANCY   If you are premenopausal and you may become pregnant, ask your health care provider about preconception counseling.  If you may become pregnant, take 400 to 800 micrograms (mcg) of folic acid every day.  If you want to prevent pregnancy, talk to your health care provider about birth control (contraception). OSTEOPOROSIS AND MENOPAUSE   Osteoporosis is a disease in which the bones lose minerals and strength with aging. This can result in serious bone fractures. Your risk for osteoporosis can be identified using a bone density scan.  If you are 36 years of age or older, or if you are at risk for osteoporosis and fractures, ask your health care provider if you should be screened.  Ask your health care provider whether you should take a calcium or vitamin D supplement to lower your risk for osteoporosis.  Menopause may have certain physical symptoms and risks.  Hormone replacement therapy may reduce some of these symptoms and risks. Talk to your health care provider about whether hormone replacement therapy is right for you.  HOME CARE INSTRUCTIONS   Schedule regular health, dental, and eye exams.  Stay current with your immunizations.   Do not use any tobacco products including cigarettes, chewing tobacco, or electronic cigarettes.  If you are pregnant, do not drink alcohol.  If you are breastfeeding, limit how much and how often you drink alcohol.  Limit alcohol intake to no more than 1 drink per day for  nonpregnant women. One drink equals 12 ounces of beer, 5 ounces of wine, or 1 ounces of hard liquor.  Do not use street drugs.  Do not share needles.  Ask your health care provider for help if you need support or information about quitting drugs.  Tell your health care provider if you often feel depressed.  Tell your health care provider if you have ever been abused or do not feel safe at home.   This information is not intended to replace advice given to you by your health care provider. Make sure you discuss any questions you have with your health care provider.   Document Released: 10/24/2010 Document Revised: 05/01/2014 Document Reviewed: 03/12/2013 Elsevier Interactive Patient Education [employer].

## 2015-04-02 ENCOUNTER — Encounter: Payer: Self-pay | Admitting: Adult Health

## 2015-08-17 ENCOUNTER — Ambulatory Visit (INDEPENDENT_AMBULATORY_CARE_PROVIDER_SITE_OTHER): Payer: Managed Care, Other (non HMO) | Admitting: Physician Assistant

## 2015-08-17 VITALS — BP 118/82 | HR 90 | Temp 98.2°F | Resp 18 | Ht 65.0 in | Wt 185.0 lb

## 2015-08-17 DIAGNOSIS — R109 Unspecified abdominal pain: Secondary | ICD-10-CM

## 2015-08-17 NOTE — Progress Notes (Signed)
   Shelia Smith  MRN: [id number] DOB: 10/30/1955  Subjective:  Pt presents to clinic with left sided abd wall pain - pain has been present for a week but not changing.  She does work out regularly and she did start a new abd wall exercise right before she had this pain.  She has pain only with movement - she has no pain when she is sitting or laying still.  It feels like a puling sensation when it hurts.    No bowel habit changes.  No pain with BMs in the location of the pain.  Home treatment - motrin not much help - no heat or ice  Patient Active Problem List   Diagnosis Date Noted  . Pure hypercholesterolemia 02/20/2014  . ABDOMINAL OR PELVIC SWELLING MASS OR LUMP RUQ 01/26/2010  . FATTY LIVER DISEASE 07/19/2009  . SUBACROMIAL BURSITIS, RIGHT 07/19/2009  . OSTEOPENIA 06/22/2008  . HYPERLIPIDEMIA 02/25/2007  . ANEMIA-NOS 02/25/2007    Current Outpatient Prescriptions on File Prior to Visit  Medication Sig Dispense Refill  . calcium-vitamin D 250-100 MG-UNIT per tablet Take 1 tablet by mouth daily.      Marland Kitchen. co-enzyme Q-10 30 MG capsule Take 30 mg by mouth daily.      . Omega-3 Fatty Acids (FISH OIL) 1000 MG CAPS Take by mouth.       No current facility-administered medications on file prior to visit.    Allergies  Allergen Reactions  . Statins     Elevated LFTS    Review of Systems  Constitutional: Negative for fever and chills.  Gastrointestinal: Negative for nausea, vomiting, diarrhea and constipation.   Objective:  BP 118/82 mmHg  Pulse 90  Temp(Src) 98.2 F (36.8 C) (Oral)  Resp 18  Ht 5\' 5"  (1.651 m)  Wt 185 lb (83.915 kg)  BMI 30.79 kg/m2  SpO2 97%  Physical Exam  Constitutional: She is oriented to person, place, and time and well-developed, well-nourished, and in no distress.  HENT:  Head: Normocephalic and atraumatic.  Right Ear: Hearing and external ear normal.  Left Ear: Hearing and external ear normal.  Eyes: Conjunctivae are normal.  Neck: Normal  range of motion.  Cardiovascular: Normal rate, regular rhythm and normal heart sounds.   No murmur heard. Pulmonary/Chest: Effort normal and breath sounds normal.  Abdominal: There is no tenderness.    Neurological: She is alert and oriented to person, place, and time. Gait normal.  Skin: Skin is warm and dry.  Psychiatric: Mood, memory, affect and judgment normal.  Vitals reviewed.   Assessment and Plan :  Abdominal wall pain - this seems like an abd wall strain - she will use motrin to help with the inflammation and decrease exercises that engage her abdominal wall.    Benny LennertSarah Weber PA-C  [facility] 08/17/2015 5:44 PM

## 2015-08-17 NOTE — Patient Instructions (Signed)
     IF you received an x-ray today, you will receive an invoice from [facility]. Please contact [facility] at [phone] with questions or concerns regarding your invoice.   IF you received labwork today, you will receive an invoice from [facility]. Please contact [facility] at [phone] with questions or concerns regarding your invoice.   Our billing staff will not be able to assist you with questions regarding bills from these companies.  You will be contacted with the lab results as soon as they are available. The fastest way to get your results is to activate your My Chart account. Instructions are located on the last page of this paperwork. If you have not heard from us regarding the results in 2 weeks, please contact this office.      

## 2015-08-19 ENCOUNTER — Ambulatory Visit (INDEPENDENT_AMBULATORY_CARE_PROVIDER_SITE_OTHER): Payer: Managed Care, Other (non HMO) | Admitting: Physician Assistant

## 2015-08-19 VITALS — BP 138/74 | HR 84 | Temp 98.4°F | Resp 18 | Ht 64.0 in | Wt 194.0 lb

## 2015-08-19 DIAGNOSIS — R109 Unspecified abdominal pain: Secondary | ICD-10-CM

## 2015-08-19 MED ORDER — DICLOFENAC SODIUM 75 MG PO TBEC: 75.0000 mg | DELAYED_RELEASE_TABLET | Freq: Two times a day (BID) | ORAL | Status: DC

## 2015-08-19 NOTE — Patient Instructions (Signed)
     IF you received an x-ray today, you will receive an invoice from [facility]. Please contact [facility] at [phone] with questions or concerns regarding your invoice.   IF you received labwork today, you will receive an invoice from [facility]. Please contact [facility] at [phone] with questions or concerns regarding your invoice.   Our billing staff will not be able to assist you with questions regarding bills from these companies.  You will be contacted with the lab results as soon as they are available. The fastest way to get your results is to activate your My Chart account. Instructions are located on the last page of this paperwork. If you have not heard from us regarding the results in 2 weeks, please contact this office.      

## 2015-08-19 NOTE — Progress Notes (Signed)
   Shelia KyleSylvia Kalafut  MRN: [id number] DOB: 02/21/1956  Subjective:  Pt presents to clinic with continued pulling sensation in her LUQ - she feels like it is getting worse.  Now when she goes to stand up she has a pulling sensation in that area.  She has not been working out to help not aggravate the pain.  She has not taken the motrin because she did not think that it would help.  She is getting worried because she would like to know what it is so she will not worry.  No change in pain with eating or BMs.  Only pain with movement.  No pain with being still.  She has not had any upper abd surgery.  Patient Active Problem List   Diagnosis Date Noted  . Pure hypercholesterolemia 02/20/2014  . ABDOMINAL OR PELVIC SWELLING MASS OR LUMP RUQ 01/26/2010  . FATTY LIVER DISEASE 07/19/2009  . SUBACROMIAL BURSITIS, RIGHT 07/19/2009  . OSTEOPENIA 06/22/2008  . HYPERLIPIDEMIA 02/25/2007  . ANEMIA-NOS 02/25/2007    Current Outpatient Prescriptions on File Prior to Visit  Medication Sig Dispense Refill  . calcium-vitamin D 250-100 MG-UNIT per tablet Take 1 tablet by mouth daily.      Marland Kitchen. co-enzyme Q-10 30 MG capsule Take 30 mg by mouth daily.      . Omega-3 Fatty Acids (FISH OIL) 1000 MG CAPS Take by mouth.       No current facility-administered medications on file prior to visit.    Allergies  Allergen Reactions  . Statins     Elevated LFTS    Review of Systems  Constitutional: Negative for fever and chills.  Gastrointestinal: Positive for abdominal pain (abd wall). Negative for nausea, vomiting, diarrhea and constipation.   Objective:  BP 138/74 mmHg  Pulse 84  Temp(Src) 98.4 F (36.9 C) (Oral)  Resp 18  Ht 5\' 4"  (1.626 m)  Wt 194 lb (87.998 kg)  BMI 33.28 kg/m2  SpO2 96%  Physical Exam  Constitutional: She is oriented to person, place, and time and well-developed, well-nourished, and in no distress.  HENT:  Head: Normocephalic and atraumatic.  Right Ear: Hearing and external ear  normal.  Left Ear: Hearing and external ear normal.  Eyes: Conjunctivae are normal.  Neck: Normal range of motion.  Pulmonary/Chest: Effort normal.  Abdominal: Soft. Bowel sounds are normal. There is tenderness (no TTP when the patient is laying still, when patient stands she has the pain - the area is TTP and there is a slightly defect felt - though no hernia is palpable).    Neurological: She is alert and oriented to person, place, and time. Gait normal.  Skin: Skin is warm and dry.  Psychiatric: Mood, memory, affect and judgment normal.  Vitals reviewed.   Assessment and Plan :  Abdominal wall pain - Plan: diclofenac (VOLTAREN) 75 MG EC tablet, US Abdomen Complete, CANCELED: US Abdomen Limited - I suspect this is an abd wall tear due to palpable area of TTP but we will send for an US to make sure there is not a hernia.  She will start NSAIDs to help with local inflammation and she will continue to rest the abd wall muscles.  Encouraged use of ice to help with inflammation.  Benny LennertSarah Jowell Bossi PA-C  [facility] 08/19/2015 6:12 PM

## 2015-09-01 ENCOUNTER — Ambulatory Visit
Admission: RE | Admit: 2015-09-01 | Discharge: 2015-09-01 | Disposition: A | Discharge status: Home / Self Care | Payer: Managed Care, Other (non HMO) | Source: Ambulatory Visit | Attending: Physician Assistant | Admitting: Physician Assistant

## 2015-09-01 DIAGNOSIS — R109 Unspecified abdominal pain: Secondary | ICD-10-CM

## 2015-09-03 ENCOUNTER — Telehealth: Payer: Self-pay

## 2015-09-03 NOTE — Telephone Encounter (Signed)
Pt is looking for ultrasound results   Best number [phone]

## 2015-09-03 NOTE — Telephone Encounter (Signed)
Sarah- please advise. °

## 2015-09-07 ENCOUNTER — Encounter: Payer: Self-pay | Admitting: Physician Assistant

## 2015-09-07 DIAGNOSIS — K439 Ventral hernia without obstruction or gangrene: Secondary | ICD-10-CM

## 2016-07-11 ENCOUNTER — Other Ambulatory Visit: Payer: Self-pay | Admitting: Adult Health

## 2016-07-11 DIAGNOSIS — Z0000 Encounter for general adult medical examination without abnormal findings: Secondary | ICD-10-CM

## 2016-07-12 ENCOUNTER — Other Ambulatory Visit (INDEPENDENT_AMBULATORY_CARE_PROVIDER_SITE_OTHER): Payer: Managed Care, Other (non HMO)

## 2016-07-12 DIAGNOSIS — Z0000 Encounter for general adult medical examination without abnormal findings: Secondary | ICD-10-CM | POA: Diagnosis not present

## 2016-07-12 LAB — HEPATIC FUNCTION PANEL
ALK PHOS: 90 U/L (ref 39–117)
ALT: 50 U/L — AB (ref 0–35)
AST: 36 U/L (ref 0–37)
Albumin: 4.3 g/dL (ref 3.5–5.2)
BILIRUBIN DIRECT: 0.1 mg/dL (ref 0.0–0.3)
TOTAL PROTEIN: 6.6 g/dL (ref 6.0–8.3)
Total Bilirubin: 0.4 mg/dL (ref 0.2–1.2)

## 2016-07-12 LAB — CBC WITH DIFFERENTIAL/PLATELET
BASOS ABS: 0 10*3/uL (ref 0.0–0.1)
Basophils Relative: 0.6 % (ref 0.0–3.0)
EOS ABS: 0.1 10*3/uL (ref 0.0–0.7)
Eosinophils Relative: 1.7 % (ref 0.0–5.0)
HCT: 39.2 % (ref 36.0–46.0)
Hemoglobin: 13 g/dL (ref 12.0–15.0)
LYMPHS ABS: 1.6 10*3/uL (ref 0.7–4.0)
Lymphocytes Relative: 40.6 % (ref 12.0–46.0)
MCHC: 33.2 g/dL (ref 30.0–36.0)
MCV: 88.2 fl (ref 78.0–100.0)
MONO ABS: 0.3 10*3/uL (ref 0.1–1.0)
Monocytes Relative: 7 % (ref 3.0–12.0)
NEUTROS PCT: 50.1 % (ref 43.0–77.0)
Neutro Abs: 2 10*3/uL (ref 1.4–7.7)
Platelets: 269 10*3/uL (ref 150.0–400.0)
RBC: 4.44 Mil/uL (ref 3.87–5.11)
RDW: 13.5 % (ref 11.5–15.5)
WBC: 4 10*3/uL (ref 4.0–10.5)

## 2016-07-12 LAB — POC URINALSYSI DIPSTICK (AUTOMATED)
Bilirubin, UA: NEGATIVE
Blood, UA: NEGATIVE
Glucose, UA: NEGATIVE
Ketones, UA: NEGATIVE
Leukocytes, UA: NEGATIVE
Nitrite, UA: NEGATIVE
Protein, UA: NEGATIVE
SPEC GRAV UA: 1.015 (ref 1.030–1.035)
UROBILINOGEN UA: 0.2 (ref ?–2.0)
pH, UA: 7.5 (ref 5.0–8.0)

## 2016-07-12 LAB — TSH: TSH: 1.91 u[IU]/mL (ref 0.35–4.50)

## 2016-07-12 LAB — BASIC METABOLIC PANEL
BUN: 8 mg/dL (ref 6–23)
CALCIUM: 9.7 mg/dL (ref 8.4–10.5)
CO2: 29 mEq/L (ref 19–32)
Chloride: 104 mEq/L (ref 96–112)
Creatinine, Ser: 0.83 mg/dL (ref 0.40–1.20)
GFR: 90.48 mL/min (ref >60.00)
Glucose, Bld: 96 mg/dL (ref 70–99)
Potassium: 4.7 mEq/L (ref 3.5–5.1)
Sodium: 139 mEq/L (ref 135–145)

## 2016-07-12 LAB — LIPID PANEL
CHOL/HDL RATIO: 3
CHOLESTEROL: 310 mg/dL — AB (ref 0–200)
HDL: 90 mg/dL (ref >39.00)
LDL Cholesterol: 199 mg/dL — ABNORMAL HIGH (ref 0–99)
NonHDL: 219.56
TRIGLYCERIDES: 105 mg/dL (ref 0.0–149.0)
VLDL: 21 mg/dL (ref 0.0–40.0)

## 2016-07-20 ENCOUNTER — Ambulatory Visit (INDEPENDENT_AMBULATORY_CARE_PROVIDER_SITE_OTHER): Payer: Managed Care, Other (non HMO) | Admitting: Adult Health

## 2016-07-20 ENCOUNTER — Encounter: Payer: Self-pay | Admitting: Adult Health

## 2016-07-20 VITALS — BP 120/72 | Temp 98.3°F | Wt 197.8 lb

## 2016-07-20 DIAGNOSIS — E7800 Pure hypercholesterolemia, unspecified: Secondary | ICD-10-CM

## 2016-07-20 DIAGNOSIS — M5442 Lumbago with sciatica, left side: Secondary | ICD-10-CM

## 2016-07-20 DIAGNOSIS — G8929 Other chronic pain: Secondary | ICD-10-CM

## 2016-07-20 DIAGNOSIS — Z0000 Encounter for general adult medical examination without abnormal findings: Secondary | ICD-10-CM | POA: Diagnosis not present

## 2016-07-20 NOTE — Progress Notes (Signed)
Subjective:    Patient ID: Shelia Smith, female    DOB: May 16, 1955, 61 y.o.   MRN: [id number]  HPI  Patient presents for yearly preventative medicine examination. She is a pleasant 61 year old female who  has a past medical history of Anemia and Hyperlipidemia.  All immunizations and health maintenance protocols were reviewed with the patient and needed orders were placed.  Medication reconciliation,  past medical history, social history, problem list and allergies were reviewed in detail with the patient  Goals were established with regard to weight loss, exercise, and  diet in compliance with medications. She has not been eating the healthiest but continues to exercise   Wt Readings from Last 3 Encounters:  07/20/16 197 lb 12.8 oz (89.7 kg)  08/19/15 194 lb (88 kg)  08/17/15 185 lb (83.9 kg)    Her last colonoscopy was in 2010- she is due in 2020. She is up to date on her pap and mammogram. She has also seen her dentist and eye doctor   She continues to have low back pain. This has been becoming worse, but not unmanageable over the course of the year. She has occasional numbness and tingling in the left leg and toes. Her last lumbar spine x ray was in 06/2013 which showed  IMPRESSION: 1. Mild multilevel lumbar spine DDD. 2. Bilateral facet degenerative change, worse at L4-L5 and L5-S1.    Review of Systems  Constitutional: Negative.   HENT: Negative.   Eyes: Negative.   Respiratory: Negative.   Cardiovascular: Negative.   Gastrointestinal: Negative.   Endocrine: Negative.   Genitourinary: Negative.   Musculoskeletal: Negative.   Allergic/Immunologic: Negative.   Neurological: Negative.   Hematological: Negative.   Psychiatric/Behavioral: Negative.   All other systems reviewed and are negative.  Past Medical History:  Diagnosis Date  . Anemia    no longer an issue since hysterectomy  . Hyperlipidemia     Social History   Social History  . Marital status:  Married    Spouse name: N/A  . Number of children: N/A  . Years of education: N/A   Occupational History  . Not on file.   Social History Main Topics  . Smoking status: Former Games developer  . Smokeless tobacco: Not on file  . Alcohol use No  . Drug use: No  . Sexual activity: Not on file   Other Topics Concern  . 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 ABDOMINAL HYSTERECTOMY    . CESAREAN SECTION     x 3  . MYOMECTOMY      Family History  Problem Relation Age of Onset  . Stroke Brother   . Stroke Other   . Hypertension Mother   . Diabetes Mother     ?  Marland Kitchen Hypertension Father   . Prostate cancer Father   . Hypertension Brother     Allergies  Allergen Reactions  . Statins     Elevated LFTS    Current Outpatient Prescriptions on File Prior to Visit  Medication Sig Dispense Refill  . calcium-vitamin D 250-100 MG-UNIT per tablet Take 1 tablet by mouth daily.      Marland Kitchen co-enzyme Q-10 30 MG capsule Take 30 mg by mouth  daily.      . Omega-3 Fatty Acids (FISH OIL) 1000 MG CAPS Take by mouth.       No current facility-administered medications on file prior to visit.     BP 120/72   Temp 98.3 F (36.8 C) (Oral)   Wt 197 lb 12.8 oz (89.7 kg)   BMI 33.95 kg/m       Objective:   Physical Exam  Constitutional: She is oriented to person, place, and time. She appears well-developed and well-nourished. No distress.  Overweight    HENT:  Head: Normocephalic and atraumatic.  Right Ear: External ear normal.  Left Ear: External ear normal.  Nose: Nose normal.  Mouth/Throat: Oropharynx is clear and moist. No oropharyngeal exudate.  Eyes: Conjunctivae and EOM are normal. Pupils are equal, round, and reactive to light. Right eye exhibits no discharge. Left eye  exhibits no discharge. No scleral icterus.  Neck: Normal range of motion. Neck supple. No JVD present. No tracheal deviation present. No thyromegaly present.  Cardiovascular: Normal rate, regular rhythm, normal heart sounds and intact distal pulses.  Exam reveals no gallop and no friction rub.   No murmur heard. Pulmonary/Chest: Effort normal and breath sounds normal. No stridor. No respiratory distress. She has no wheezes. She has no rales. She exhibits no tenderness.  Abdominal: Soft. Bowel sounds are normal. She exhibits no distension and no mass. There is no tenderness. There is no rebound and no guarding.  Genitourinary:  Genitourinary Comments: Has been done by GYN this year   Musculoskeletal: Normal range of motion. She exhibits deformity. She exhibits no edema or tenderness.  Lymphadenopathy:    She has no cervical adenopathy.  Neurological: She is alert and oriented to person, place, and time. No cranial nerve deficit. Coordination normal.  Skin: Skin is warm and dry. No rash noted. She is not diaphoretic. No erythema. No pallor.  Psychiatric: She has a normal mood and affect. Her behavior is normal. Judgment and thought content normal.  Nursing note and vitals reviewed.     Assessment & Plan:  1. Routine general medical examination at a health care facility - Reviewed labs in detail with patient.  - Educated on the importance of diet and exercise   2. Pure hypercholesterolemia -  Lab Results  Component Value Date   CHOL 310 (H) 07/12/2016   HDL 90.00 07/12/2016   LDLCALC 199 (H) 07/12/2016   LDLDIRECT 189.9 04/03/2012   TRIG 105.0 07/12/2016   CHOLHDL 3 07/12/2016  - she has a 2.5% 10 year cardiac risk  - Work on diet. I am sure some of this is genetic    3. Chronic left-sided low back pain with left-sided sciatica - I discussed referring to orthopedics. She will inform me when she is ready to do this.   Shirline Freesory Syniyah Bourne, NP

## 2016-11-10 LAB — HM MAMMOGRAPHY

## 2016-11-14 ENCOUNTER — Encounter: Payer: Self-pay | Admitting: Family Medicine

## 2017-01-12 ENCOUNTER — Encounter: Payer: Self-pay | Admitting: Adult Health

## 2017-01-16 NOTE — Telephone Encounter (Signed)
Please advise Cory, thanks.  

## 2017-01-17 ENCOUNTER — Other Ambulatory Visit: Payer: Self-pay | Admitting: Adult Health

## 2017-01-17 DIAGNOSIS — G4730 Sleep apnea, unspecified: Secondary | ICD-10-CM

## 2017-02-20 ENCOUNTER — Encounter: Payer: Self-pay | Admitting: Pulmonary Disease

## 2017-02-20 ENCOUNTER — Ambulatory Visit (INDEPENDENT_AMBULATORY_CARE_PROVIDER_SITE_OTHER): Payer: 59 | Admitting: Pulmonary Disease

## 2017-02-20 VITALS — BP 120/78 | HR 76 | Ht 64.0 in | Wt 199.4 lb

## 2017-02-20 DIAGNOSIS — R0683 Snoring: Secondary | ICD-10-CM | POA: Diagnosis not present

## 2017-02-20 DIAGNOSIS — G4733 Obstructive sleep apnea (adult) (pediatric): Secondary | ICD-10-CM

## 2017-02-20 NOTE — Progress Notes (Signed)
Subjective:    Patient ID: Shelia Smith, female    DOB: 12-13-1955, 60 y.o.   MRN: [id number]  HPI  Chief Complaint  Patient presents with  . Sleep Consult    Uses a sleep tracker at night. Per the app, she is only sleeping for 3hrs in deep sleep. States she feels tired in the mornings 50% of the time. Per her husband, she does snore at night. Denies ever having a sleep study done before.     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 Her husband gifted her with a fitness tracker and she has been tracking her sleep, this reports 6.5-7 hours of total sleep time but only reports 3 hours of sleep with the other reports being restless sleep.  Hence the concern.  She does report non-refreshing sleep about 50% of the time with loud snoring has been noted by family members for many years.  She also reports tossing and turning times during her sleep.  Epworth sleepiness score is 1 and she denies daytime somnolence or fatigue. She is able to get day without naps and denies afternoon on weekends. Bedtime is between 10 and 11 PM, sleep latency is about 20 minutes, she sleeps only on her side with one pillow, reports 1-2 nocturnal awakenings including nocturia and is out of bed by 5:45 AM feeling refreshed without dryness of mouth or headaches. She has gained about 10 pounds in the last 2 years. She reports 1 cup of coffee in the mornings and denies excessive use of caffeinated beverages.  There is no history suggestive of cataplexy, sleep paralysis or parasomnias     Past Medical History:  Diagnosis Date  . Anemia    no longer an issue since hysterectomy  . Hyperlipidemia      Past Surgical History:  Procedure Laterality Date  . ABDOMINAL HYSTERECTOMY    . CESAREAN SECTION     x 3  . MYOMECTOMY       Allergies  Allergen Reactions  . Statins     Elevated LFTS    Social History   Social History  . Marital status: Married   Spouse name: N/A  . Number of children: N/A  . Years of education: N/A   Occupational History  . Not on file.   Social History Main Topics  . Smoking status: Former Games developer  . Smokeless tobacco: Never Used  . Alcohol use No  . Drug use: No  . Sexual activity: Not on file   Other Topics Concern  . 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 Stroke Brother   . Stroke Other   . Hypertension Mother   . Diabetes Mother        ?  Marland Kitchen Hypertension Father   . Prostate cancer Father   . Hypertension Brother      Review of Systems Constitutional: negative for anorexia, fevers and sweats  Eyes: negative for irritation, redness and visual disturbance  Ears, nose, mouth, throat, and face: negative for earaches, epistaxis, nasal congestion and sore throat  Respiratory: negative for cough, dyspnea on exertion, sputum and wheezing  Cardiovascular: negative for chest pain, dyspnea, lower extremity edema, orthopnea, palpitations and syncope  Gastrointestinal: negative for abdominal pain, constipation, diarrhea, melena, nausea and vomiting  Genitourinary:negative for dysuria, frequency and hematuria  Hematologic/lymphatic: negative for bleeding, easy bruising and lymphadenopathy  Musculoskeletal:negative for arthralgias, muscle weakness and stiff joints  Neurological: negative for coordination problems, gait problems, headaches and weakness  Endocrine: negative for diabetic symptoms including polydipsia, polyuria and weight loss     Objective:   Physical Exam  Gen. Pleasant, well-nourished, in no distress, normal affect ENT - no lesions, no post nasal drip Neck: No JVD, no thyromegaly, no carotid bruits Lungs: no use of accessory muscles, no dullness to percussion, clear without  rales or rhonchi  Cardiovascular: Rhythm regular, heart sounds  normal, no murmurs or gallops, no peripheral edema Abdomen: soft and non-tender, no hepatosplenomegaly, BS normal. Musculoskeletal: No deformities, no cyanosis or clubbing Neuro:  alert, non focal       Assessment & Plan:

## 2017-02-20 NOTE — Patient Instructions (Signed)
Home sleep study 

## 2017-02-20 NOTE — Assessment & Plan Note (Signed)
Given non refreshing sleep & loud snoring, obstructive sleep apnea is very likely & an overnight polysomnogram will be scheduled as a home study. The pathophysiology of obstructive sleep apnea , it's cardiovascular consequences & modes of treatment including CPAP were discused with the patient in detail & they evidenced understanding.

## 2017-03-07 DIAGNOSIS — G4733 Obstructive sleep apnea (adult) (pediatric): Secondary | ICD-10-CM | POA: Diagnosis not present

## 2017-03-08 DIAGNOSIS — G4733 Obstructive sleep apnea (adult) (pediatric): Secondary | ICD-10-CM

## 2017-03-09 ENCOUNTER — Telehealth: Payer: Self-pay | Admitting: Pulmonary Disease

## 2017-03-09 NOTE — Telephone Encounter (Signed)
Per RA, HST showed mild OSA with 8 events per hour. Suggests observation and weight loss 10-15lbs. No RX for now.   Follow up in 6 months.

## 2017-03-13 ENCOUNTER — Other Ambulatory Visit: Payer: Self-pay | Admitting: *Deleted

## 2017-03-13 DIAGNOSIS — G4733 Obstructive sleep apnea (adult) (pediatric): Secondary | ICD-10-CM

## 2017-06-27 ENCOUNTER — Encounter: Payer: Self-pay | Admitting: Emergency Medicine

## 2017-06-27 ENCOUNTER — Other Ambulatory Visit: Payer: Self-pay

## 2017-06-27 ENCOUNTER — Ambulatory Visit (INDEPENDENT_AMBULATORY_CARE_PROVIDER_SITE_OTHER): Payer: 59 | Admitting: Emergency Medicine

## 2017-06-27 VITALS — BP 151/79 | HR 81 | Temp 98.1°F | Resp 18 | Ht 64.17 in | Wt 199.8 lb

## 2017-06-27 DIAGNOSIS — M25552 Pain in left hip: Secondary | ICD-10-CM

## 2017-06-27 DIAGNOSIS — M7072 Other bursitis of hip, left hip: Secondary | ICD-10-CM | POA: Diagnosis not present

## 2017-06-27 MED ORDER — DICLOFENAC SODIUM 75 MG PO TBEC: 75.0000 mg | DELAYED_RELEASE_TABLET | Freq: Two times a day (BID) | ORAL | 0 refills | Status: AC

## 2017-06-27 NOTE — Progress Notes (Signed)
Shelia Smith 62 y.o.   Chief Complaint  Patient presents with  . Hip Pain    X 1 week- left side    HISTORY OF PRESENT ILLNESS: This is a 62 y.o. female complaining of left hip pain for 1 week.  Constant nagging pain with no radiation.  Works out 4 times a week.  Denies any neurological symptoms.  No bladder or bowel symptoms.  No fever or chills.  Able to walk okay.  Dictating patient.  No other significant symptoms.  Hip Pain   The incident occurred 5 to 7 days ago. There was no injury mechanism. The pain is present in the left hip. The pain is at a severity of 5/10. The pain is moderate. The pain has been fluctuating since onset. Pertinent negatives include no inability to bear weight, loss of motion, loss of sensation, muscle weakness, numbness or tingling. The symptoms are aggravated by movement. She has tried NSAIDs for the symptoms. The treatment provided mild relief.     Prior to Admission medications   Medication Sig Start Date End Date Taking? Authorizing Provider  calcium-vitamin D 250-100 MG-UNIT per tablet Take 1 tablet by mouth daily.     Yes [provider]  co-enzyme Q-10 30 MG capsule Take 30 mg by mouth daily.     Yes [provider]  Omega-3 Fatty Acids (FISH OIL) 1000 MG CAPS Take by mouth.     Yes [provider]    Allergies  Allergen Reactions  . Statins     Elevated LFTS    Patient Active Problem List   Diagnosis Date Noted  . Snoring 02/20/2017  . Pure hypercholesterolemia 02/20/2014  . ABDOMINAL OR PELVIC SWELLING MASS OR LUMP RUQ 01/26/2010  . FATTY LIVER DISEASE 07/19/2009  . SUBACROMIAL BURSITIS, RIGHT 07/19/2009  . OSTEOPENIA 06/22/2008  . HYPERLIPIDEMIA 02/25/2007  . ANEMIA-NOS 02/25/2007    Past Medical History:  Diagnosis Date  . Anemia    no longer an issue since hysterectomy  . Hyperlipidemia     Past Surgical History:  Procedure Laterality Date  . ABDOMINAL HYSTERECTOMY    . CESAREAN SECTION     x 3    . MYOMECTOMY      Social History   Socioeconomic History  . Marital status: Married    Spouse name: Not on file  . Number of children: 2  . Years of education: Not on file  . Highest education level: Not on file  Social Needs  . Financial resource strain: Not on file  . Food insecurity - worry: Not on file  . Food insecurity - inability: Not on file  . Transportation needs - medical: Not on file  . Transportation needs - non-medical: Not on file  Occupational History  . Not on file  Tobacco Use  . Smoking status: Former Games developermoker  . Smokeless tobacco: Never Used  Substance and Sexual Activity  . Alcohol use: No    Alcohol/week: 0.0 oz  . Drug use: No  . Sexual activity: Yes  Other Topics Concern  . 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 Stroke Brother   . Stroke Other   . Hypertension Mother   . Diabetes Mother        ?  .Marland Kitchen  Hypertension Father   . Prostate cancer Father   . Hypertension Brother      Review of Systems  Constitutional: Negative.  Negative for chills, fever, malaise/fatigue and weight loss.  HENT: Negative.  Negative for sore throat.   Eyes: Negative.  Negative for blurred vision and double vision.  Respiratory: Negative.  Negative for shortness of breath.   Cardiovascular: Negative.  Negative for chest pain, palpitations and leg swelling.  Gastrointestinal: Negative.  Negative for abdominal pain, diarrhea, nausea and vomiting.  Genitourinary: Negative.  Negative for dysuria.  Musculoskeletal: Positive for joint pain (left hip).  Skin: Negative.  Negative for rash.  Neurological: Negative.  Negative for dizziness, tingling, sensory change, focal weakness, numbness and headaches.  Endo/Heme/Allergies: Negative.    Vitals:   06/27/17 1216   BP: (!) 151/79  Pulse: 81  Resp: 18  Temp: 98.1 F (36.7 C)  SpO2: 95%     Physical Exam  Constitutional: She is oriented to person, place, and time. She appears well-developed and well-nourished.  HENT:  Head: Normocephalic and atraumatic.  Eyes: EOM are normal. Pupils are equal, round, and reactive to light.  Neck: Normal range of motion. Neck supple.  Cardiovascular: Normal rate, regular rhythm and normal heart sounds.  Pulmonary/Chest: Effort normal and breath sounds normal.  Abdominal: Soft. She exhibits no distension. There is no tenderness.  Musculoskeletal:       Lumbar back: She exhibits decreased range of motion. She exhibits no tenderness, no bony tenderness, no swelling, no spasm and normal pulse.  Left hip: Mild tenderness with full range of motion.  Neurological: She is alert and oriented to person, place, and time. She displays normal reflexes. No sensory deficit. She exhibits normal muscle tone. Coordination normal.  Skin: Skin is warm and dry. Capillary refill takes less than 2 seconds.  Psychiatric: She has a normal mood and affect. Her behavior is normal.  Vitals reviewed.    ASSESSMENT & PLAN: Shelia Smith was seen today for hip pain.  Diagnoses and all orders for this visit:  Left hip pain -     diclofenac (VOLTAREN) 75 MG EC tablet; Take 1 tablet (75 mg total) by mouth 2 (two) times daily for 5 days.  Bursitis of left hip, unspecified bursa -     diclofenac (VOLTAREN) 75 MG EC tablet; Take 1 tablet (75 mg total) by mouth 2 (two) times daily for 5 days.    Patient Instructions       IF you received an x-ray today, you will receive an invoice from [facility]. Please contact [facility] at [phone] with questions or concerns regarding your invoice.   IF you received labwork today, you will receive an invoice from [facility]. Please contact [facility] at [phone] with questions or concerns regarding your invoice.   Our billing  staff will not be able to assist you with questions regarding bills from these companies.  You will be contacted with the lab results as soon as they are available. The fastest way to get your results is to activate your My Chart account. Instructions are located on the last page of this paperwork. If you have not heard from [place] regarding the results in 2 weeks, please contact this office.     Hip Pain The hip is the joint between the upper legs and the lower pelvis. The bones, cartilage, tendons, and muscles of your hip joint support your body and allow you to move around. Hip pain can range from a minor ache to  severe pain in one or both of your hips. The pain may be felt on the inside of the hip joint near the groin, or the outside near the buttocks and upper thigh. You may also have swelling or stiffness. Follow these instructions at home: Managing pain, stiffness, and swelling  If directed, apply ice to the injured area. ? Put ice in a plastic bag. ? Place a towel between your skin and the bag. ? Leave the ice on for 20 minutes, 2-3 times a day  Sleep with a pillow between your legs on your most comfortable side.  Avoid any activities that cause pain. General instructions  Take over-the-counter and prescription medicines only as told by your health care provider.  Do any exercises as told by your health care provider.  Record the following: ? How often you have hip pain. ? The location of your pain. ? What the pain feels like. ? What makes the pain worse.  Keep all follow-up visits as told by your health care provider. This is important. Contact a health care provider if:  You cannot put weight on your leg.  Your pain or swelling continues or gets worse after one week.  It gets harder to walk.  You have a fever. Get help right away if:  You fall.  You have a sudden increase in pain and swelling in your hip.  Your hip is red or swollen or very tender to  touch. Summary  Hip pain can range from a minor ache to severe pain in one or both of your hips.  The pain may be felt on the inside of the hip joint near the groin, or the outside near the buttocks and upper thigh.  Avoid any activities that cause pain.  Record how often you have hip pain, the location of the pain, what makes it worse and what it feels like. This information is not intended to replace advice given to you by your health care provider. Make sure you discuss any questions you have with your health care provider. Document Released: 09/28/2009 Document Revised: 03/13/2016 Document Reviewed: 03/13/2016 Elsevier Interactive Patient Education  2018 Elsevier Inc.      Edwina Barth, MD [facility]

## 2017-06-27 NOTE — Patient Instructions (Addendum)
     IF you received an x-ray today, you will receive an invoice from [facility]. Please contact [facility] at [phone] with questions or concerns regarding your invoice.   IF you received labwork today, you will receive an invoice from [facility]. Please contact [facility] at [phone] with questions or concerns regarding your invoice.   Our billing staff will not be able to assist you with questions regarding bills from these companies.  You will be contacted with the lab results as soon as they are available. The fastest way to get your results is to activate your My Chart account. Instructions are located on the last page of this paperwork. If you have not heard from us regarding the results in 2 weeks, please contact this office.     Hip Pain The hip is the joint between the upper legs and the lower pelvis. The bones, cartilage, tendons, and muscles of your hip joint support your body and allow you to move around. Hip pain can range from a minor ache to severe pain in one or both of your hips. The pain may be felt on the inside of the hip joint near the groin, or the outside near the buttocks and upper thigh. You may also have swelling or stiffness. Follow these instructions at home: Managing pain, stiffness, and swelling  If directed, apply ice to the injured area. ? Put ice in a plastic bag. ? Place a towel between your skin and the bag. ? Leave the ice on for 20 minutes, 2-3 times a day  Sleep with a pillow between your legs on your most comfortable side.  Avoid any activities that cause pain. General instructions  Take over-the-counter and prescription medicines only as told by your health care provider.  Do any exercises as told by your health care provider.  Record the following: ? How often you have hip pain. ? The location of your pain. ? What the pain feels like. ? What makes the pain worse.  Keep all follow-up visits as told by your health  care provider. This is important. Contact a health care provider if:  You cannot put weight on your leg.  Your pain or swelling continues or gets worse after one week.  It gets harder to walk.  You have a fever. Get help right away if:  You fall.  You have a sudden increase in pain and swelling in your hip.  Your hip is red or swollen or very tender to touch. Summary  Hip pain can range from a minor ache to severe pain in one or both of your hips.  The pain may be felt on the inside of the hip joint near the groin, or the outside near the buttocks and upper thigh.  Avoid any activities that cause pain.  Record how often you have hip pain, the location of the pain, what makes it worse and what it feels like. This information is not intended to replace advice given to you by your health care provider. Make sure you discuss any questions you have with your health care provider. Document Released: 09/28/2009 Document Revised: 03/13/2016 Document Reviewed: 03/13/2016 Elsevier Interactive Patient Education  2018 Elsevier Inc.  

## 2017-07-19 ENCOUNTER — Encounter: Payer: Self-pay | Admitting: Adult Health

## 2017-08-03 ENCOUNTER — Ambulatory Visit (INDEPENDENT_AMBULATORY_CARE_PROVIDER_SITE_OTHER): Payer: 59 | Admitting: Adult Health

## 2017-08-03 ENCOUNTER — Encounter: Payer: Self-pay | Admitting: Adult Health

## 2017-08-03 VITALS — BP 110/84 | Temp 98.4°F | Ht 64.0 in | Wt 198.0 lb

## 2017-08-03 DIAGNOSIS — E7800 Pure hypercholesterolemia, unspecified: Secondary | ICD-10-CM | POA: Diagnosis not present

## 2017-08-03 DIAGNOSIS — Z23 Encounter for immunization: Secondary | ICD-10-CM

## 2017-08-03 DIAGNOSIS — Z0000 Encounter for general adult medical examination without abnormal findings: Secondary | ICD-10-CM | POA: Diagnosis not present

## 2017-08-03 LAB — CBC WITH DIFFERENTIAL/PLATELET
Basophils Absolute: 0 K/uL (ref 0.0–0.1)
Basophils Relative: 0.6 % (ref 0.0–3.0)
Eosinophils Absolute: 0.1 K/uL (ref 0.0–0.7)
Eosinophils Relative: 2.2 % (ref 0.0–5.0)
HCT: 40.7 % (ref 36.0–46.0)
Hemoglobin: 13.8 g/dL (ref 12.0–15.0)
Lymphocytes Relative: 49.3 % — ABNORMAL HIGH (ref 12.0–46.0)
Lymphs Abs: 1.8 K/uL (ref 0.7–4.0)
MCHC: 33.8 g/dL (ref 30.0–36.0)
MCV: 87.6 fl (ref 78.0–100.0)
Monocytes Absolute: 0.2 K/uL (ref 0.1–1.0)
Monocytes Relative: 6.3 % (ref 3.0–12.0)
Neutro Abs: 1.5 K/uL (ref 1.4–7.7)
Neutrophils Relative %: 41.6 % — ABNORMAL LOW (ref 43.0–77.0)
Platelets: 238 K/uL (ref 150.0–400.0)
RBC: 4.65 Mil/uL (ref 3.87–5.11)
RDW: 13.2 % (ref 11.5–15.5)
WBC: 3.6 K/uL — ABNORMAL LOW (ref 4.0–10.5)

## 2017-08-03 LAB — LIPID PANEL
Cholesterol: 262 mg/dL — ABNORMAL HIGH (ref 0–200)
HDL: 80.7 mg/dL (ref >39.00)
LDL Cholesterol: 163 mg/dL — ABNORMAL HIGH (ref 0–99)
NONHDL: 181.46
Total CHOL/HDL Ratio: 3
Triglycerides: 90 mg/dL (ref 0.0–149.0)
VLDL: 18 mg/dL (ref 0.0–40.0)

## 2017-08-03 LAB — HEPATIC FUNCTION PANEL
ALBUMIN: 4.3 g/dL (ref 3.5–5.2)
ALK PHOS: 82 U/L (ref 39–117)
ALT: 35 U/L (ref 0–35)
AST: 33 U/L (ref 0–37)
BILIRUBIN DIRECT: 0.1 mg/dL (ref 0.0–0.3)
TOTAL PROTEIN: 6.9 g/dL (ref 6.0–8.3)
Total Bilirubin: 0.4 mg/dL (ref 0.2–1.2)

## 2017-08-03 LAB — BASIC METABOLIC PANEL
BUN: 7 mg/dL (ref 6–23)
CHLORIDE: 104 meq/L (ref 96–112)
CO2: 30 mEq/L (ref 19–32)
Calcium: 9.6 mg/dL (ref 8.4–10.5)
Creatinine, Ser: 0.87 mg/dL (ref 0.40–1.20)
GFR: 85.38 mL/min (ref >60.00)
Glucose, Bld: 90 mg/dL (ref 70–99)
POTASSIUM: 4.2 meq/L (ref 3.5–5.1)
Sodium: 140 mEq/L (ref 135–145)

## 2017-08-03 LAB — TSH: TSH: 1.58 u[IU]/mL (ref 0.35–4.50)

## 2017-08-03 NOTE — Progress Notes (Signed)
Subjective:    Patient ID: Shelia Smith, female    DOB: 1955-08-28, 62 y.o.   MRN: [id number]  HPI  Patient presents for yearly preventative medicine examination. She is a pleasant 62 year old female who  has a past medical history of Anemia and Hyperlipidemia.  She takes Omega 3 Fatty Acids due to history of hyperlipidemia  Lab Results  Component Value Date   CHOL 310 (H) 07/12/2016   HDL 90.00 07/12/2016   LDLCALC 199 (H) 07/12/2016   LDLDIRECT 189.9 04/03/2012   TRIG 105.0 07/12/2016   CHOLHDL 3 07/12/2016    All immunizations and health maintenance protocols were reviewed with the patient and needed orders were placed.  Appropriate screening laboratory values were ordered for the patient including screening of hyperlipidemia, renal function and hepatic function.  Medication reconciliation,  past medical history, social history, problem list and allergies were reviewed in detail with the patient  Goals were established with regard to weight loss, exercise, and  diet in compliance with medications. She is exercising 3-4 times a week. She is eating healthy.   Wt Readings from Last 3 Encounters:  08/03/17 198 lb (89.8 kg)  06/27/17 199 lb 12.8 oz (90.6 kg)  02/20/17 199 lb 6.4 oz (90.4 kg)   She is up-to-date on health maintenance items such as colonoscopy, mammogram, Pap, dental and vision screens.   She has no acute issues   Review of Systems  Constitutional: Negative.   HENT: Negative.   Eyes: Negative.   Respiratory: Negative.   Cardiovascular: Negative.   Gastrointestinal: Negative.   Endocrine: Negative.   Genitourinary: Negative.   Musculoskeletal: Negative.   Skin: Negative.   Allergic/Immunologic: Negative.   Neurological: Negative.   Hematological: Negative.   Psychiatric/Behavioral: Negative.    Past Medical History:  Diagnosis Date  . Anemia    no longer an issue since hysterectomy  . Hyperlipidemia     Social History   Socioeconomic History    . Marital status: Married    Spouse name: Not on file  . Number of children: 2  . Years of education: Not on file  . Highest education level: Not on file  Occupational History  . Not on file  Social Needs  . Financial resource strain: Not on file  . Food insecurity:    Worry: Not on file    Inability: Not on file  . Transportation needs:    Medical: Not on file    Non-medical: Not on file  Tobacco Use  . Smoking status: Former Games developer  . Smokeless tobacco: Never Used  Substance and Sexual Activity  . Alcohol use: No    Alcohol/week: 0.0 oz  . Drug use: No  . Sexual activity: Yes  Lifestyle  . Physical activity:    Days per week: Not on file    Minutes per session: Not on file  . Stress: Not on file  Relationships  . Social connections:    Talks on phone: Not on file    Gets together: Not on file    Attends religious service: Not on file    Active member of club or organization: Not on file    Attends meetings of clubs or organizations: Not on file    Relationship status: Not on file  . Intimate partner violence:    Fear of current or ex partner: Not on file    Emotionally abused: Not on file    Physically abused: Not on file    Forced  sexual activity: Not on file  Other Topics Concern  . 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 ABDOMINAL HYSTERECTOMY    . CESAREAN SECTION     x 3  . MYOMECTOMY      Family History  Problem Relation Age of Onset  . Stroke Brother   . Stroke Other   . Hypertension Mother   . Diabetes Mother        ?  Marland Kitchen. Hypertension Father   . Prostate cancer Father   . Hypertension Brother     Allergies  Allergen Reactions  . Statins     Elevated LFTS    Current Outpatient Medications on File Prior to Visit  Medication  Sig Dispense Refill  . calcium-vitamin D 250-100 MG-UNIT per tablet Take 1 tablet by mouth daily.      Marland Kitchen. co-enzyme Q-10 30 MG capsule Take 30 mg by mouth daily.      . Omega-3 Fatty Acids (FISH OIL) 1000 MG CAPS Take by mouth.       No current facility-administered medications on file prior to visit.     There were no vitals taken for this visit.      Objective:   Physical Exam  Constitutional: She is oriented to person, place, and time. She appears well-developed and well-nourished. No distress.  HENT:  Head: Normocephalic and atraumatic.  Right Ear: External ear normal.  Left Ear: External ear normal.  Nose: Nose normal.  Mouth/Throat: Oropharynx is clear and moist. No oropharyngeal exudate.  Eyes: Pupils are equal, round, and reactive to light. Conjunctivae and EOM are normal. Right eye exhibits no discharge. Left eye exhibits no discharge. No scleral icterus.  Neck: Normal range of motion. Neck supple. No JVD present. No tracheal deviation present. No thyromegaly present.  Cardiovascular: Normal rate, regular rhythm, normal heart sounds and intact distal pulses. Exam reveals no gallop and no friction rub.  No murmur heard. Pulmonary/Chest: Effort normal and breath sounds normal. No stridor. No respiratory distress. She has no wheezes. She has no rales. She exhibits no tenderness.  Abdominal: Soft. Bowel sounds are normal. She exhibits no distension and no mass. There is no tenderness. There is no rebound and no guarding.  Musculoskeletal: Normal range of motion. She exhibits no edema, tenderness or deformity.  Lymphadenopathy:    She has no cervical adenopathy.  Neurological: She is alert and oriented to person, place, and time. She has normal reflexes. She displays normal reflexes. No cranial nerve deficit. She exhibits normal muscle tone. Coordination normal.  Skin: Skin is warm and dry. No rash noted. She is not diaphoretic. No erythema. No pallor.  Psychiatric: She has a  normal mood and affect. Her behavior is normal. Judgment and thought content normal.  Nursing note and vitals reviewed.     Assessment & Plan:  1. Routine general medical examination at a health care facility - Follow up in one year or sooner if needed - Basic metabolic panel - CBC with Differential/Platelet - Hepatic function panel - Lipid panel - TSH  2. Pure hypercholesterolemia - Consider fenofibrate or Vascepa  - Basic metabolic panel - CBC with Differential/Platelet - Hepatic function panel - Lipid panel - TSH  3. Need for Tdap vaccination  -  Tdap vaccine greater than or equal to 7yo IM   Shirline Frees, NP

## 2017-11-02 ENCOUNTER — Other Ambulatory Visit (INDEPENDENT_AMBULATORY_CARE_PROVIDER_SITE_OTHER): Payer: 59

## 2017-11-02 ENCOUNTER — Other Ambulatory Visit: Payer: 59

## 2017-11-02 DIAGNOSIS — E7800 Pure hypercholesterolemia, unspecified: Secondary | ICD-10-CM

## 2017-11-02 LAB — LIPID PANEL
CHOLESTEROL: 268 mg/dL — AB (ref 0–200)
HDL: 76 mg/dL (ref >39.00)
LDL Cholesterol: 171 mg/dL — ABNORMAL HIGH (ref 0–99)
NonHDL: 191.67
Total CHOL/HDL Ratio: 4
Triglycerides: 105 mg/dL (ref 0.0–149.0)
VLDL: 21 mg/dL (ref 0.0–40.0)

## 2017-11-05 ENCOUNTER — Encounter: Payer: Self-pay | Admitting: Adult Health

## 2017-11-06 ENCOUNTER — Other Ambulatory Visit: Payer: Self-pay | Admitting: Adult Health

## 2017-11-06 DIAGNOSIS — E7800 Pure hypercholesterolemia, unspecified: Secondary | ICD-10-CM

## 2017-11-06 MED ORDER — FENOFIBRATE 48 MG PO TABS: 48.0000 mg | ORAL_TABLET | Freq: Every day | ORAL | 0 refills | Status: DC

## 2017-11-16 DIAGNOSIS — Z1231 Encounter for screening mammogram for malignant neoplasm of breast: Secondary | ICD-10-CM | POA: Diagnosis not present

## 2017-11-16 LAB — HM MAMMOGRAPHY

## 2017-11-30 ENCOUNTER — Encounter: Payer: Self-pay | Admitting: Family Medicine

## 2017-11-30 DIAGNOSIS — H5213 Myopia, bilateral: Secondary | ICD-10-CM | POA: Diagnosis not present

## 2017-11-30 DIAGNOSIS — Z0100 Encounter for examination of eyes and vision without abnormal findings: Secondary | ICD-10-CM | POA: Diagnosis not present

## 2017-12-28 ENCOUNTER — Encounter: Payer: Self-pay | Admitting: Adult Health

## 2017-12-28 DIAGNOSIS — Z1211 Encounter for screening for malignant neoplasm of colon: Secondary | ICD-10-CM

## 2018-01-07 ENCOUNTER — Encounter: Payer: Self-pay | Admitting: Gastroenterology

## 2018-01-29 ENCOUNTER — Encounter: Payer: Self-pay | Admitting: Adult Health

## 2018-01-31 ENCOUNTER — Other Ambulatory Visit: Payer: Self-pay | Admitting: Adult Health

## 2018-01-31 ENCOUNTER — Other Ambulatory Visit (INDEPENDENT_AMBULATORY_CARE_PROVIDER_SITE_OTHER): Payer: 59

## 2018-01-31 DIAGNOSIS — E7800 Pure hypercholesterolemia, unspecified: Secondary | ICD-10-CM

## 2018-01-31 LAB — HEPATIC FUNCTION PANEL
ALBUMIN: 4.2 g/dL (ref 3.5–5.2)
ALT: 57 U/L — AB (ref 0–35)
AST: 53 U/L — AB (ref 0–37)
Alkaline Phosphatase: 79 U/L (ref 39–117)
BILIRUBIN DIRECT: 0.1 mg/dL (ref 0.0–0.3)
TOTAL PROTEIN: 6.7 g/dL (ref 6.0–8.3)
Total Bilirubin: 0.3 mg/dL (ref 0.2–1.2)

## 2018-01-31 LAB — LIPID PANEL
CHOL/HDL RATIO: 3
Cholesterol: 256 mg/dL — ABNORMAL HIGH (ref 0–200)
HDL: 79.5 mg/dL (ref >39.00)
LDL Cholesterol: 159 mg/dL — ABNORMAL HIGH (ref 0–99)
NONHDL: 176.85
Triglycerides: 91 mg/dL (ref 0.0–149.0)
VLDL: 18.2 mg/dL (ref 0.0–40.0)

## 2018-02-12 ENCOUNTER — Encounter: Payer: Self-pay | Admitting: Gastroenterology

## 2018-02-12 ENCOUNTER — Ambulatory Visit (INDEPENDENT_AMBULATORY_CARE_PROVIDER_SITE_OTHER): Payer: 59 | Admitting: Gastroenterology

## 2018-02-12 VITALS — BP 100/70 | HR 78 | Ht 65.0 in | Wt 199.6 lb

## 2018-02-12 DIAGNOSIS — Z1211 Encounter for screening for malignant neoplasm of colon: Secondary | ICD-10-CM

## 2018-02-12 NOTE — Progress Notes (Signed)
[facility] Consult Note:  History: Shelia Smith 02/12/2018  Referring physician: Shirline Frees, NP  Reason for consult/chief complaint: Colon Cancer Screening (No GI issues )   Subjective  HPI:  This is a very pleasant 62 year old woman self-referred to discuss colon cancer screening.  She had a normal screening colonoscopy by Dr. Jarold Motto with a good bowel preparation in March 2010.  She had discussed it with her insurance and learned that they would cover her to have a screening colonoscopy this year even though it was not quite 10 years since her last exam.  She denies change in bowel habits, abdominal pain or rectal bleeding.   ROS:  Review of Systems She denies chest pain dyspnea or dysuria Past Medical History: Past Medical History:  Diagnosis Date  . Anemia    no longer an issue since hysterectomy  . Hyperlipidemia   . OSA (obstructive sleep apnea)      Past Surgical History: Past Surgical History:  Procedure Laterality Date  . ABDOMINAL HYSTERECTOMY    . CESAREAN SECTION     x 3  . MYOMECTOMY       Family History: Family History  Problem Relation Age of Onset  . Stroke Brother   . Stroke Other   . Hypertension Mother   . Diabetes Mother        ?  Marland Kitchen Hypertension Father   . Prostate cancer Father   . Hypertension Brother     Social History: Social History   Socioeconomic History  . Marital status: Married    Spouse name: Not on file  . Number of children: 2  . Years of education: Not on file  . Highest education level: Not on file  Occupational History  . Not on file  Social Needs  . Financial resource strain: Not on file  . Food insecurity:    Worry: Not on file    Inability: Not on file  . Transportation needs:    Medical: Not on file    Non-medical: Not on file  Tobacco Use  . Smoking status: Former Games developer  . Smokeless tobacco: Never Used  Substance and Sexual Activity  . Alcohol use: No   Alcohol/week: 0.0 standard drinks  . Drug use: No  . Sexual activity: Yes  Lifestyle  . Physical activity:    Days per week: Not on file    Minutes per session: Not on file  . Stress: Not on file  Relationships  . Social connections:    Talks on phone: Not on file    Gets together: Not on file    Attends religious service: Not on file    Active member of club or organization: Not on file    Attends meetings of clubs or organizations: Not on file    Relationship status: Not on file  Other Topics Concern  . 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 Fenofibrate     Elevated Lft  . Statins     Elevated LFTS    Outpatient Meds: Current Outpatient Medications  Medication Sig Dispense Refill  . calcium-vitamin D 250-100 MG-UNIT per tablet Take 1 tablet by mouth daily.       No current facility-administered medications for this visit.  ___________________________________________________________________ Objective   Exam:  BP 100/70   Pulse 78   Ht 5\' 5"  (1.651 m)   Wt 199 lb 9.6 oz (90.5 kg)   BMI 33.22 kg/m    General: this is a(n) well-appearing woman  Eyes: sclera anicteric, no redness  ENT: oral mucosa moist without lesions, no cervical or supraclavicular lymphadenopathy, good dentition  CV: RRR without murmur, S1/S2, no JVD, no peripheral edema  Resp: clear to auscultation bilaterally, normal RR and effort noted  GI: soft, no tenderness, with active bowel sounds. No guarding or palpable organomegaly noted.   Assessment: Encounter Diagnosis  Name Primary?  . Special screening for malignant neoplasms, colon Yes    Ultimately, she decided to wait until next spring to have her colonoscopy.  I gave her contact information.  At that time, she could be  directly booked for a visit with our endoscopy nurse and schedule for screening colonoscopy with me.    Thank you for the courtesy of this consult.  Please call me with any questions or concerns.  Charlie Pitter III  CC: Shirline Frees, NP

## 2018-04-26 DIAGNOSIS — Z6833 Body mass index (BMI) 33.0-33.9, adult: Secondary | ICD-10-CM | POA: Diagnosis not present

## 2018-04-26 DIAGNOSIS — Z130 Encounter for screening for diseases of the blood and blood-forming organs and certain disorders involving the immune mechanism: Secondary | ICD-10-CM | POA: Diagnosis not present

## 2018-04-26 DIAGNOSIS — Z01419 Encounter for gynecological examination (general) (routine) without abnormal findings: Secondary | ICD-10-CM | POA: Diagnosis not present

## 2018-04-26 DIAGNOSIS — N762 Acute vulvitis: Secondary | ICD-10-CM | POA: Diagnosis not present

## 2018-04-26 DIAGNOSIS — Z1389 Encounter for screening for other disorder: Secondary | ICD-10-CM | POA: Diagnosis not present

## 2018-05-06 DIAGNOSIS — Z0100 Encounter for examination of eyes and vision without abnormal findings: Secondary | ICD-10-CM | POA: Diagnosis not present

## 2018-05-27 ENCOUNTER — Encounter: Payer: Self-pay | Admitting: Gastroenterology

## 2018-06-10 ENCOUNTER — Ambulatory Visit (AMBULATORY_SURGERY_CENTER): Payer: Self-pay

## 2018-06-10 VITALS — Ht 65.0 in | Wt 192.4 lb

## 2018-06-10 DIAGNOSIS — Z1211 Encounter for screening for malignant neoplasm of colon: Secondary | ICD-10-CM

## 2018-06-10 MED ORDER — PEG-KCL-NACL-NASULF-NA ASC-C 140 G PO SOLR: 1.0000 | Freq: Once | ORAL | Status: AC

## 2018-06-10 NOTE — Progress Notes (Signed)
Per pt, no allergies to soy or egg products.Pt not taking any weight loss meds or using  O2 at home.  Pt refused emmi video. 

## 2018-06-11 ENCOUNTER — Encounter: Payer: Self-pay | Admitting: Gastroenterology

## 2018-06-24 ENCOUNTER — Encounter: Payer: Self-pay | Admitting: Gastroenterology

## 2018-06-24 ENCOUNTER — Ambulatory Visit (AMBULATORY_SURGERY_CENTER): Payer: 59 | Admitting: Gastroenterology

## 2018-06-24 VITALS — BP 111/67 | HR 71 | Temp 98.2°F | Resp 12 | Ht 65.0 in | Wt 192.0 lb

## 2018-06-24 DIAGNOSIS — G4733 Obstructive sleep apnea (adult) (pediatric): Secondary | ICD-10-CM | POA: Diagnosis not present

## 2018-06-24 DIAGNOSIS — E7800 Pure hypercholesterolemia, unspecified: Secondary | ICD-10-CM | POA: Diagnosis not present

## 2018-06-24 DIAGNOSIS — Z1211 Encounter for screening for malignant neoplasm of colon: Secondary | ICD-10-CM | POA: Diagnosis not present

## 2018-06-24 MED ORDER — SODIUM CHLORIDE 0.9 % IV SOLN: 500.0000 mL | Freq: Once | INTRAVENOUS | Status: DC

## 2018-06-24 NOTE — Progress Notes (Signed)
Report given to PACU, vss 

## 2018-06-24 NOTE — Patient Instructions (Signed)
YOU HAD AN ENDOSCOPIC PROCEDURE TODAY AT THE [facility]:   Refer to the procedure report that was given to you for any specific questions about what was found during the examination.  If the procedure report does not answer your questions, please call your gastroenterologist to clarify.  If you requested that your care partner not be given the details of your procedure findings, then the procedure report has been included in a sealed envelope for you to review at your convenience later.  YOU SHOULD EXPECT: Some feelings of bloating in the abdomen. Passage of more gas than usual.  Walking can help get rid of the air that was put into your GI tract during the procedure and reduce the bloating. If you had a lower endoscopy (such as a colonoscopy or flexible sigmoidoscopy) you may notice spotting of blood in your stool or on the toilet paper. If you underwent a bowel prep for your procedure, you may not have a normal bowel movement for a few days.  Please Note:  You might notice some irritation and congestion in your nose or some drainage.  This is from the oxygen used during your procedure.  There is no need for concern and it should clear up in a day or so.  SYMPTOMS TO REPORT IMMEDIATELY:   Following lower endoscopy (colonoscopy or flexible sigmoidoscopy):  Excessive amounts of blood in the stool  Significant tenderness or worsening of abdominal pains  Swelling of the abdomen that is new, acute  Fever of 100F or higher   For urgent or emergent issues, a gastroenterologist can be reached at any hour by calling [phone].   DIET:  We do recommend a small meal at first, but then you may proceed to your regular diet.  Drink plenty of fluids but you should avoid alcoholic beverages for 24 hours. Try to eat a high fiber diet, and drink plenty of water.  ACTIVITY:  You should plan to take it easy for the rest of today and you should NOT DRIVE or use heavy machinery until tomorrow  (because of the sedation medicines used during the test).    FOLLOW UP: Our staff will call the number listed on your records the next business day following your procedure to check on you and address any questions or concerns that you may have regarding the information given to you following your procedure. If we do not reach you, we will leave a message.  However, if you are feeling well and you are not experiencing any problems, there is no need to return our call.  We will assume that you have returned to your regular daily activities without incident.  If any biopsies were taken you will be contacted by phone or by letter within the next 1-3 weeks.  Please call us at [phone] if you have not heard about the biopsies in 3 weeks.    SIGNATURES/CONFIDENTIALITY: You and/or your care partner have signed paperwork which will be entered into your electronic medical record.  These signatures attest to the fact that that the information above on your After Visit Summary has been reviewed and is understood.  Full responsibility of the confidentiality of this discharge information lies with you and/or your care-partner.  Handouts given on diverticulosis.

## 2018-06-24 NOTE — Op Note (Signed)
[facility] Patient Name: Shelia Smith Procedure Date: 06/24/2018 7:58 AM MRN: [id number] Endoscopist: Sherilyn Cooter L. Myrtie Neither , MD Age: 63 Referring MD:  Date of Birth: Apr 18, 1956 Gender: Female Account #: [id number] Procedure:                Colonoscopy Indications:              Screening for colorectal malignant neoplasm (no                            polyps 06/2008) Medicines:                Monitored Anesthesia Care Procedure:                Pre-Anesthesia Assessment:                           - Prior to the procedure, a History and Physical                            was performed, and patient medications and                            allergies were reviewed. The patient's tolerance of                            previous anesthesia was also reviewed. The risks                            and benefits of the procedure and the sedation                            options and risks were discussed with the patient.                            All questions were answered, and informed consent                            was obtained. Prior Anticoagulants: The patient has                            taken no previous anticoagulant or antiplatelet                            agents. ASA Grade Assessment: II - A patient with                            mild systemic disease. After reviewing the risks                            and benefits, the patient was deemed in                            satisfactory condition to undergo the procedure.  After obtaining informed consent, the colonoscope                            was passed under direct vision. Throughout the                            procedure, the patient's blood pressure, pulse, and                            oxygen saturations were monitored continuously. The                            Colonoscope was introduced through the anus and                            advanced to the the cecum, identified by                    appendiceal orifice and ileocecal valve. The                            colonoscopy was performed with difficulty due to                            significant looping and a tortuous colon.                            Successful completion of the procedure was aided by                            using manual pressure and withdrawing the scope and                            replacing with the pediatric colonoscope. The                            patient tolerated the procedure well. The quality                            of the bowel preparation was excellent. The                            ileocecal valve, appendiceal orifice, and rectum                            were photographed. Scope In: 8:20:22 AM Scope Out: 8:33:59 AM Scope Withdrawal Time: 0 hours 9 minutes 20 seconds  Total Procedure Duration: 0 hours 13 minutes 37 seconds  Findings:                 The perianal and digital rectal examinations were                            normal.  A few diverticula were found in the sigmoid colon.                           Retroflexion in the rectum was not performed due to                            anatomy.                           The exam was otherwise without abnormality. Complications:            No immediate complications. Estimated Blood Loss:     Estimated blood loss: none. Impression:               - Diverticulosis in the sigmoid colon.                           - The examination was otherwise normal.                           - No specimens collected. Recommendation:           - Patient has a contact number available for                            emergencies. The signs and symptoms of potential                            delayed complications were discussed with the                            patient. Return to normal activities tomorrow.                            Written discharge instructions were provided to the                             patient.                           - Resume previous diet.                           - Continue present medications.                           - Repeat colonoscopy in 10 years for screening                            purposes. Henry L. Myrtie Neither, MD 06/24/2018 8:37:14 AM This report has been signed electronically.

## 2018-06-24 NOTE — Progress Notes (Signed)
Pt's states no medical or surgical changes since previsit or office visit. 

## 2018-06-25 ENCOUNTER — Telehealth: Payer: Self-pay

## 2018-06-25 NOTE — Telephone Encounter (Signed)
  Follow up Call-  Call back number 06/24/2018  Post procedure Call Back phone  # [phone]  Permission to leave phone message Yes  Some recent data might be hidden     Patient questions:  Do you have a fever, pain , or abdominal swelling? No. Pain Score  0 *  Have you tolerated food without any problems? Yes.    Have you been able to return to your normal activities? Yes.    Do you have any questions about your discharge instructions: Diet   No. Medications  No. Follow up visit  No.  Do you have questions or concerns about your Care? No.  Actions: * If pain score is 4 or above: No action needed, pain <4.

## 2018-07-18 ENCOUNTER — Encounter: Payer: Self-pay | Admitting: Gastroenterology

## 2018-10-01 DIAGNOSIS — Z0100 Encounter for examination of eyes and vision without abnormal findings: Secondary | ICD-10-CM | POA: Diagnosis not present

## 2018-11-22 DIAGNOSIS — Z1231 Encounter for screening mammogram for malignant neoplasm of breast: Secondary | ICD-10-CM | POA: Diagnosis not present

## 2018-11-22 LAB — HM MAMMOGRAPHY

## 2018-11-27 ENCOUNTER — Encounter: Payer: Self-pay | Admitting: Family Medicine

## 2019-03-27 ENCOUNTER — Other Ambulatory Visit: Payer: Self-pay

## 2019-03-28 ENCOUNTER — Encounter

## 2019-03-28 ENCOUNTER — Ambulatory Visit (INDEPENDENT_AMBULATORY_CARE_PROVIDER_SITE_OTHER): Payer: 59 | Admitting: Adult Health

## 2019-03-28 ENCOUNTER — Encounter: Payer: Self-pay | Admitting: Adult Health

## 2019-03-28 VITALS — BP 118/84 | Temp 97.5°F | Ht 64.0 in | Wt 195.0 lb

## 2019-03-28 DIAGNOSIS — E7800 Pure hypercholesterolemia, unspecified: Secondary | ICD-10-CM | POA: Diagnosis not present

## 2019-03-28 DIAGNOSIS — R748 Abnormal levels of other serum enzymes: Secondary | ICD-10-CM

## 2019-03-28 DIAGNOSIS — Z0000 Encounter for general adult medical examination without abnormal findings: Secondary | ICD-10-CM

## 2019-03-28 LAB — CBC WITH DIFFERENTIAL/PLATELET
Basophils Absolute: 0 10*3/uL (ref 0.0–0.1)
Basophils Relative: 0.6 % (ref 0.0–3.0)
Eosinophils Absolute: 0.1 10*3/uL (ref 0.0–0.7)
Eosinophils Relative: 2 % (ref 0.0–5.0)
HCT: 41.3 % (ref 36.0–46.0)
Hemoglobin: 13.8 g/dL (ref 12.0–15.0)
Lymphocytes Relative: 44.2 % (ref 12.0–46.0)
Lymphs Abs: 1.7 10*3/uL (ref 0.7–4.0)
MCHC: 33.4 g/dL (ref 30.0–36.0)
MCV: 88.3 fl (ref 78.0–100.0)
Monocytes Absolute: 0.3 10*3/uL (ref 0.1–1.0)
Monocytes Relative: 7.5 % (ref 3.0–12.0)
Neutro Abs: 1.7 10*3/uL (ref 1.4–7.7)
Neutrophils Relative %: 45.7 % (ref 43.0–77.0)
Platelets: 245 10*3/uL (ref 150.0–400.0)
RBC: 4.68 Mil/uL (ref 3.87–5.11)
RDW: 13.4 % (ref 11.5–15.5)
WBC: 3.8 10*3/uL — ABNORMAL LOW (ref 4.0–10.5)

## 2019-03-28 LAB — COMPREHENSIVE METABOLIC PANEL
ALT: 34 U/L (ref 0–35)
AST: 31 U/L (ref 0–37)
Albumin: 4.4 g/dL (ref 3.5–5.2)
Alkaline Phosphatase: 79 U/L (ref 39–117)
BUN: 12 mg/dL (ref 6–23)
CO2: 29 mEq/L (ref 19–32)
Calcium: 9.6 mg/dL (ref 8.4–10.5)
Chloride: 104 mEq/L (ref 96–112)
Creatinine, Ser: 0.9 mg/dL (ref 0.40–1.20)
GFR: 76.83 mL/min (ref >60.00)
Glucose, Bld: 103 mg/dL — ABNORMAL HIGH (ref 70–99)
Potassium: 4.7 mEq/L (ref 3.5–5.1)
Sodium: 140 mEq/L (ref 135–145)
Total Bilirubin: 0.5 mg/dL (ref 0.2–1.2)
Total Protein: 6.7 g/dL (ref 6.0–8.3)

## 2019-03-28 LAB — LIPID PANEL
Cholesterol: 301 mg/dL — ABNORMAL HIGH (ref 0–200)
HDL: 81.7 mg/dL (ref >39.00)
LDL Cholesterol: 196 mg/dL — ABNORMAL HIGH (ref 0–99)
NonHDL: 219.67
Total CHOL/HDL Ratio: 4
Triglycerides: 118 mg/dL (ref 0.0–149.0)
VLDL: 23.6 mg/dL (ref 0.0–40.0)

## 2019-03-28 LAB — TSH: TSH: 2.02 u[IU]/mL (ref 0.35–4.50)

## 2019-03-28 NOTE — Progress Notes (Signed)
Subjective:    Patient ID: Shelia Smith, female    DOB: Aug 04, 1955, 63 y.o.   MRN: [id number]  HPI Patient presents for yearly preventative medicine examination. She is a pleasant 63 year old who  has a past medical history of Anemia, Hyperlipidemia, and OSA (obstructive sleep apnea).  Hyperlipidemia - takes omega 3 fish oil. She is unable to take fenofibrate or statins due to both medications causing elevated liver enzymes. Lab Results  Component Value Date   CHOL 256 (H) 01/31/2018   HDL 79.50 01/31/2018   LDLCALC 159 (H) 01/31/2018   LDLDIRECT 189.9 04/03/2012   TRIG 91.0 01/31/2018   CHOLHDL 3 01/31/2018   All immunizations and health maintenance protocols were reviewed with the patient and needed orders were placed. She refuses flu shot.   Appropriate screening laboratory values were ordered for the patient including screening of hyperlipidemia, renal function and hepatic function.  Medication reconciliation,  past medical history, social history, problem list and allergies were reviewed in detail with the patient  Goals were established with regard to weight loss, exercise, and  diet in compliance with medications. She continues to exercise 4 days a week and tries to eat healthy  Wt Readings from Last 3 Encounters:  03/28/19 195 lb (88.5 kg)  06/24/18 192 lb (87.1 kg)  06/10/18 192 lb 6.4 oz (87.3 kg)   End of life planning was discussed.  She is up-to-date on routine health maintenance items such as colonoscopy, mammogram, and Pap.   Review of Systems  Constitutional: Negative.   HENT: Negative.   Eyes: Negative.   Respiratory: Negative.   Cardiovascular: Negative.   Gastrointestinal: Negative.   Endocrine: Negative.   Genitourinary: Negative.   Musculoskeletal: Negative.   Skin: Negative.   Allergic/Immunologic: Negative.   Neurological: Negative.   Hematological: Negative.   Psychiatric/Behavioral: Negative.    Past Medical History:  Diagnosis Date  .  Anemia    no longer an issue since hysterectomy  . Hyperlipidemia    diet controlled  . OSA (obstructive sleep apnea)    no c-pap used    Social History   Socioeconomic History  . Marital status: Married    Spouse name: Not on file  . Number of children: 2  . Years of education: Not on file  . Highest education level: Not on file  Occupational History  . Not on file  Social Needs  . Financial resource strain: Not on file  . Food insecurity    Worry: Not on file    Inability: Not on file  . Transportation needs    Medical: Not on file    Non-medical: Not on file  Tobacco Use  . Smoking status: Former Games developer  . Smokeless tobacco: Never Used  Substance and Sexual Activity  . Alcohol use: No    Alcohol/week: 0.0 standard drinks  . Drug use: No  . Sexual activity: Yes  Lifestyle  . Physical activity    Days per week: Not on file    Minutes per session: Not on file  . Stress: Not on file  Relationships  . Social Musician on phone: Not on file    Gets together: Not on file    Attends religious service: Not on file    Active member of club or organization: Not on file    Attends meetings of clubs or organizations: Not on file    Relationship status: Not on file  . Intimate partner violence  Fear of current or ex partner: Not on file    Emotionally abused: Not on file    Physically abused: Not on file    Forced sexual activity: Not on file  Other Topics Concern  . 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 ABDOMINAL HYSTERECTOMY     still has ovaries  . CESAREAN SECTION  [id number],1989, 1993,   x 3  . COLONOSCOPY    . MYOMECTOMY      Family History  Problem Relation Age of Onset  . Stroke Brother   . Stroke Other   . Hypertension Mother    . Diabetes Mother        ?  Marland Kitchen. Hypertension Father   . Prostate cancer Father   . Hypertension Brother     Allergies  Allergen Reactions  . Fenofibrate     Elevated Lft  . Statins     Elevated LFTS    Current Outpatient Medications on File Prior to Visit  Medication Sig Dispense Refill  . calcium-vitamin D 250-100 MG-UNIT per tablet Take 1 tablet by mouth daily.      . clotrimazole-betamethasone (LOTRISONE) cream     . co-enzyme Q-10 30 MG capsule Take 30 mg by mouth daily.    . Omega-3 Fatty Acids (OMEGA-3 PO) Take 1,360 mg by mouth daily.     No current facility-administered medications on file prior to visit.     There were no vitals taken for this visit.      Objective:   Physical Exam Vitals signs and nursing note reviewed.  Constitutional:      General: She is not in acute distress.    Appearance: Normal appearance. She is well-developed.  HENT:     Head: Normocephalic and atraumatic.     Right Ear: Tympanic membrane, ear canal and external ear normal. There is no impacted cerumen.     Left Ear: Tympanic membrane, ear canal and external ear normal. There is no impacted cerumen.     Nose: Nose normal. No congestion or rhinorrhea.     Mouth/Throat:     Mouth: Mucous membranes are moist.     Pharynx: Oropharynx is clear. No oropharyngeal exudate or posterior oropharyngeal erythema.  Eyes:     General:        Right eye: No discharge.        Left eye: No discharge.     Conjunctiva/sclera: Conjunctivae normal.     Pupils: Pupils are equal, round, and reactive to light.  Neck:     Musculoskeletal: Normal range of motion and neck supple.     Thyroid: No thyromegaly.     Trachea: No tracheal deviation.  Cardiovascular:     Rate and Rhythm: Normal rate and regular rhythm.     Pulses: Normal pulses.     Heart sounds: Normal heart sounds. No murmur. No friction rub. No gallop.   Pulmonary:     Effort: Pulmonary effort is normal. No respiratory distress.     Breath  sounds: Normal breath sounds. No stridor. No wheezing, rhonchi or rales.  Chest:     Chest wall: No tenderness.  Abdominal:     General: Bowel sounds are normal. There is no distension.  Palpations: Abdomen is soft. There is no mass.     Tenderness: There is no abdominal tenderness. There is no right CVA tenderness, left CVA tenderness, guarding or rebound.     Hernia: No hernia is present.  Musculoskeletal: Normal range of motion.        General: No swelling, tenderness, deformity or signs of injury.     Right lower leg: No edema.     Left lower leg: No edema.  Lymphadenopathy:     Cervical: No cervical adenopathy.  Skin:    General: Skin is warm and dry.     Coloration: Skin is not jaundiced or pale.     Findings: No bruising, erythema, lesion or rash.  Neurological:     General: No focal deficit present.     Mental Status: She is alert and oriented to person, place, and time. Mental status is at baseline.     Cranial Nerves: No cranial nerve deficit.     Sensory: No sensory deficit.     Motor: No weakness.     Coordination: Coordination normal.     Gait: Gait normal.     Deep Tendon Reflexes: Reflexes normal.  Psychiatric:        Mood and Affect: Mood normal.        Behavior: Behavior normal.        Thought Content: Thought content normal.        Judgment: Judgment normal.       Assessment & Plan:  1. Routine general medical examination at a health care facility - Continue to work on weight loss through diet and exercise - Follow up in one year or sooner if needed - CBC with Differential/Platelet - CMP - Lipid panel - TSH  2. Pure hypercholesterolemia - Consider Vascepa  - CBC with Differential/Platelet - CMP - Lipid panel - TSH  3. Elevated liver enzymes  - CBC with Differential/Platelet - CMP - Lipid panel - TSH  Dorothyann Peng, NP

## 2019-04-02 ENCOUNTER — Other Ambulatory Visit: Payer: Self-pay | Admitting: Family Medicine

## 2019-04-02 DIAGNOSIS — E7800 Pure hypercholesterolemia, unspecified: Secondary | ICD-10-CM

## 2019-04-15 ENCOUNTER — Telehealth: Payer: Self-pay | Admitting: *Deleted

## 2019-04-15 NOTE — Telephone Encounter (Signed)
A message was left, re: her new patient appointment. 

## 2019-04-30 DIAGNOSIS — Z01419 Encounter for gynecological examination (general) (routine) without abnormal findings: Secondary | ICD-10-CM | POA: Diagnosis not present

## 2019-04-30 DIAGNOSIS — Z130 Encounter for screening for diseases of the blood and blood-forming organs and certain disorders involving the immune mechanism: Secondary | ICD-10-CM | POA: Diagnosis not present

## 2019-04-30 DIAGNOSIS — Z6832 Body mass index (BMI) 32.0-32.9, adult: Secondary | ICD-10-CM | POA: Diagnosis not present

## 2019-04-30 DIAGNOSIS — Z1389 Encounter for screening for other disorder: Secondary | ICD-10-CM | POA: Diagnosis not present

## 2019-06-02 ENCOUNTER — Ambulatory Visit (INDEPENDENT_AMBULATORY_CARE_PROVIDER_SITE_OTHER): Payer: 59 | Admitting: Internal Medicine

## 2019-06-02 ENCOUNTER — Other Ambulatory Visit: Payer: Self-pay

## 2019-06-02 ENCOUNTER — Encounter: Payer: Self-pay | Admitting: Internal Medicine

## 2019-06-02 VITALS — BP 132/76 | HR 83 | Ht 64.0 in | Wt 195.4 lb

## 2019-06-02 DIAGNOSIS — E7849 Other hyperlipidemia: Secondary | ICD-10-CM

## 2019-06-02 NOTE — Progress Notes (Signed)
LIPID CLINIC CONSULT NOTE  Chief Complaint:  Manage dyslipidemia  Primary Care Physician: Dorothyann Peng, NP  Primary Cardiologist:  No primary care provider on file.  HPI:  Shelia Smith is a 64 y.o. female who is being seen today for the evaluation of dyslipidemia at the request of Dorothyann Peng, NP.  This is a pleasant 64 year old female who is very physically active.  She exercises regularly.  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  She has never had any heart problems.  She reports that she has had high cholesterol for most of her adult life.  She has been tried on statins before as well as fenofibrate, both raised her liver enzymes.  She said remotely she may have had an ultrasound of her liver when she was seeing Dr. Arnoldo Morale, prior to transitioning over to her current provider.  There is is history of stroke in her brother as well as high blood pressure in her mother and she also had elevated cholesterol.  She has 4 brothers, but she not aware that any of them have high cholesterol.  She denies any chest pain or shortness of breath or any exercise intolerance.  Her lipids are markedly abnormal, total cholesterol 301, triglycerides 118, HDL 81 and LDL 196.  These findings are concerning for familial hyperlipidemia however I wonder if this is a pathogenic familial hyperlipidemia or perhaps mutation in ApoB which in some instances could be nonpathogenic.  PMHx:  Past Medical History:  Diagnosis Date  . Anemia    no longer an issue since hysterectomy  . Hyperlipidemia    diet controlled  . OSA (obstructive sleep apnea)    no c-pap used    Past Surgical History:  Procedure Laterality Date  . ABDOMINAL HYSTERECTOMY     still has ovaries  . CESAREAN SECTION  [id number], 1993,   x 3  . COLONOSCOPY    . MYOMECTOMY      FAMHx:  Family History  Problem Relation Age of Onset  . Stroke Brother   . Stroke Other   . Hypertension Mother   . Diabetes Mother        ?    Marland Kitchen Hypertension Father   . Prostate cancer Father   . Hypertension Brother     SOCHx:   reports that she has quit smoking. She has never used smokeless tobacco. She reports that she does not drink alcohol or use drugs.  ALLERGIES:  Allergies  Allergen Reactions  . Fenofibrate     Elevated Lft  . Statins     Elevated LFTS    ROS: Pertinent items noted in HPI and remainder of comprehensive ROS otherwise negative.  HOME MEDS: Current Outpatient Medications on File Prior to Visit  Medication Sig Dispense Refill  . calcium-vitamin D 250-100 MG-UNIT per tablet Take 1 tablet by mouth daily.      . clotrimazole-betamethasone (LOTRISONE) cream     . co-enzyme Q-10 30 MG capsule Take 30 mg by mouth daily.    . Omega-3 Fatty Acids (OMEGA-3 PO) Take 1,360 mg by mouth daily.     No current facility-administered medications on file prior to visit.    LABS/IMAGING: No results found for this or any previous visit (from the past 48 hour(s)). No results found.  LIPID PANEL:    Component Value Date/Time   CHOL 301 (H) 03/28/2019 0751   TRIG 118.0 03/28/2019 0751   HDL 81.70 03/28/2019 0751   CHOLHDL 4 03/28/2019 0751  VLDL 23.6 03/28/2019 0751   LDLCALC 196 (H) 03/28/2019 0751   LDLDIRECT 189.9 04/03/2012 0835    WEIGHTS: Wt Readings from Last 3 Encounters:  06/02/19 195 lb 6.4 oz (88.6 kg)  03/28/19 195 lb (88.5 kg)  06/24/18 192 lb (87.1 kg)    VITALS: BP 132/76   Pulse 83   Ht 5\' 4"  (1.626 m)   Wt 195 lb 6.4 oz (88.6 kg)   SpO2 93%   BMI 33.54 kg/m   EXAM: General appearance: alert and no distress Neck: no carotid bruit, no JVD and thyroid not enlarged, symmetric, no tenderness/mass/nodules Lungs: clear to auscultation bilaterally Heart: regular rate and rhythm, S1, S2 normal, no murmur, click, rub or gallop Abdomen: soft, non-tender; bowel sounds normal; no masses,  no organomegaly Extremities: extremities normal, atraumatic, no cyanosis or edema Pulses: 2+  and symmetric Skin: Skin color, texture, turgor normal. No rashes or lesions Neurologic: Grossly normal Psych: Pleasant  EKG: Deferred  ASSESSMENT: 1. Probable familial hyperlipidemia 2. Statin intolerance-elevated LFTs 3. Fenofibrate intolerance-elevated LFTs  PLAN: 1.   Ms. Bamburg has a significantly elevated lipid profile with predominantly high HDL and LDL cholesterol.  She has no known coronary disease and has not had prior events.  She is very active and eats quite healthy.  I suspect this is a familial hyperlipidemia.  It may however not be a pathogenic dyslipidemia.  There is no history of early onset heart disease although some stroke in the family.  I would like to get a coronary calcium score to further risk stratify her.  Ultimately, we would have to consider PCSK9 inhibitor if we were to desire more than 50% reduction in her LDL cholesterol.  We also could consider genetic testing and if she were to have a nonpathogenic FH variant, then it would be reassuring and treatment may not be indicated.  Thanks for the interesting referral.  Gaye Alken, MD, [facility]  [place]  [facility]  Medical Director of the [facility]  Card[facility]ovascular Risk Reduction Clinic Diplomate of the American Board of Clinical Lipidology Attending Cardiologist  Direct Dial: [phone]  Fax: [phone]  Website:  [email or web address] Secret Kristensen 06/02/2019, 4:31 PM

## 2019-06-02 NOTE — Patient Instructions (Signed)
Medication Instructions:  Your physician recommends that you continue on your current medications as directed. Please refer to the Current Medication list given to you today.  If you need a refill on your cardiac medications before your next appointment, please call your pharmacy.   Lab work: NONE  Testing/Procedures: Coronary Calcium Score  Follow-Up: At [facility], you and your health needs are our priority.  As part of our continuing mission to provide you with exceptional heart care, we have created designated Provider Care Teams.  These Care Teams include your primary Cardiologist (physician) and Advanced Practice Providers (APPs -  Physician Assistants and Nurse Practitioners) who all work together to provide you with the care you need, when you need it. You may see Dr. Debara Pickett or one of the following Advanced Practice Providers on your designated Care Team:    Almyra Deforest, PA-C  Fabian Sharp, [place] or   Roby Lofts, [place]  Your physician wants you to follow-up in: 3 months with Dr. Debara Pickett  Any Other Special Instructions Will Be Listed Below (If Applicable).   Coronary Calcium Scan A coronary calcium scan is an imaging test used to look for deposits of plaque in the inner lining of the blood vessels of the heart (coronary arteries). Plaque is made up of calcium, protein, and fatty substances. These deposits of plaque can partly clog and narrow the coronary arteries without producing any symptoms or warning signs. This puts a person at risk for a heart attack. This test is recommended for people who are at moderate risk for heart disease. The test can find plaque deposits before symptoms develop. Tell a health care provider about:  Any allergies you have.  All medicines you are taking, including vitamins, herbs, eye drops, creams, and over-the-counter medicines.  Any problems you or family members have had with anesthetic medicines.  Any blood disorders you have.  Any  surgeries you have had.  Any medical conditions you have.  Whether you are pregnant or may be pregnant. What are the risks? Generally, this is a safe procedure. However, problems may occur, including:  Harm to a pregnant woman and her unborn baby. This test involves the use of radiation. Radiation exposure can be dangerous to a pregnant woman and her unborn baby. If you are pregnant or think you may be pregnant, you should not have this procedure done.  Slight increase in the risk of cancer. This is because of the radiation involved in the test. What happens before the procedure? Ask your health care provider for any specific instructions on how to prepare for this procedure. You may be asked to avoid products that contain caffeine, tobacco, or nicotine for 4 hours before the procedure. What happens during the procedure?   You will undress and remove any jewelry from your neck or chest.  You will put on a hospital gown.  Sticky electrodes will be placed on your chest. The electrodes will be connected to an electrocardiogram (ECG) machine to record a tracing of the electrical activity of your heart.  You will lie down on a curved bed that is attached to the [place].  You may be given medicine to slow down your heart rate so that clear pictures can be created.  You will be moved into the CT scanner, and the CT scanner will take pictures of your heart. During this time, you will be asked to lie still and hold your breath for 2-3 seconds at a time while each picture of your  heart is being taken. The procedure may vary among health care providers and hospitals. What happens after the procedure?  You can get dressed.  You can return to your normal activities.  It is up to you to get the results of your procedure. Ask your health care provider, or the department that is doing the procedure, when your results will be ready. Summary  A coronary calcium scan is an imaging test used to  look for deposits of plaque in the inner lining of the blood vessels of the heart (coronary arteries). Plaque is made up of calcium, protein, and fatty substances.  Generally, this is a safe procedure. Tell your health care provider if you are pregnant or may be pregnant.  Ask your health care provider for any specific instructions on how to prepare for this procedure.  A CT scanner will take pictures of your heart.  You can return to your normal activities after the scan is done. This information is not intended to replace advice given to you by your health care provider. Make sure you discuss any questions you have with your health care provider. Document Revised: 10/29/2018 Document Reviewed: 10/29/2018 Elsevier Patient Education  2020 [employer].

## 2019-06-19 ENCOUNTER — Ambulatory Visit (INDEPENDENT_AMBULATORY_CARE_PROVIDER_SITE_OTHER)
Admission: RE | Admit: 2019-06-19 | Discharge: 2019-06-19 | Disposition: A | Discharge status: Home / Self Care | Payer: Self-pay | Source: Ambulatory Visit | Attending: Internal Medicine | Admitting: Internal Medicine

## 2019-06-19 ENCOUNTER — Other Ambulatory Visit: Payer: Self-pay

## 2019-06-19 DIAGNOSIS — E7849 Other hyperlipidemia: Secondary | ICD-10-CM

## 2019-08-20 ENCOUNTER — Ambulatory Visit: Payer: 59 | Attending: Internal Medicine

## 2019-08-20 DIAGNOSIS — Z20822 Contact with and (suspected) exposure to covid-19: Secondary | ICD-10-CM | POA: Diagnosis not present

## 2019-08-21 LAB — NOVEL CORONAVIRUS, NAA: SARS-CoV-2, NAA: NOT DETECTED

## 2019-08-21 LAB — SARS-COV-2, NAA 2 DAY TAT

## 2019-08-27 ENCOUNTER — Ambulatory Visit: Payer: 59 | Attending: Internal Medicine

## 2019-08-27 DIAGNOSIS — Z23 Encounter for immunization: Secondary | ICD-10-CM

## 2019-08-27 NOTE — Progress Notes (Signed)
   Covid-19 Vaccination Clinic  Name:  Shelia Smith    MRN: [id number] DOB: [birth year]  08/27/2019  [name] was observed post Covid-19 immunization for 15 minutes without incident. She was provided with Vaccine Information Sheet and instruction to access the V-Safe system.   [name] was instructed to call 911 with any severe reactions post vaccine: Marland Kitchen Difficulty breathing  . Swelling of face and throat  . A fast heartbeat  . A bad rash all over body  . Dizziness and weakness   Immunizations Administered    Name Date Dose VIS Date Route   Pfizer COVID-19 Vaccine 08/27/2019  8:27 AM 0.3 mL 06/18/2018 Intramuscular   Manufacturer: [employer], [employer]   Lot: [id number]   NDC: [address]-9

## 2019-08-28 ENCOUNTER — Ambulatory Visit: Payer: 59

## 2019-09-05 ENCOUNTER — Ambulatory Visit: Payer: 59 | Admitting: Internal Medicine

## 2019-09-23 ENCOUNTER — Ambulatory Visit: Payer: No Typology Code available for payment source | Attending: Internal Medicine

## 2019-09-23 DIAGNOSIS — Z23 Encounter for immunization: Secondary | ICD-10-CM

## 2019-09-23 NOTE — Progress Notes (Signed)
   Covid-19 Vaccination Clinic  Name:  Jeronda Don    MRN: [id number] DOB: 03-Mar-1956  09/23/2019  Ms. Weddington was observed post Covid-19 immunization for 15 minutes without incident. She was provided with Vaccine Information Sheet and instruction to access the V-Safe system.   Ms. Balster was instructed to call 911 with any severe reactions post vaccine: Marland Kitchen Difficulty breathing  . Swelling of face and throat  . A fast heartbeat  . A bad rash all over body  . Dizziness and weakness   Immunizations Administered    Name Date Dose VIS Date Route   Pfizer COVID-19 Vaccine 09/23/2019  8:13 AM 0.3 mL 06/18/2018 Intramuscular   Manufacturer: [employer], [employer]   Lot: [id number]   NDC: [address]-6

## 2019-11-25 ENCOUNTER — Encounter: Payer: Self-pay | Admitting: Adult Health

## 2019-11-25 DIAGNOSIS — Z1231 Encounter for screening mammogram for malignant neoplasm of breast: Secondary | ICD-10-CM | POA: Diagnosis not present

## 2020-03-31 ENCOUNTER — Ambulatory Visit (INDEPENDENT_AMBULATORY_CARE_PROVIDER_SITE_OTHER): Payer: No Typology Code available for payment source | Admitting: Adult Health

## 2020-03-31 ENCOUNTER — Encounter: Payer: No Typology Code available for payment source | Admitting: Adult Health

## 2020-03-31 ENCOUNTER — Other Ambulatory Visit: Payer: Self-pay

## 2020-03-31 ENCOUNTER — Encounter: Payer: Self-pay | Admitting: Adult Health

## 2020-03-31 VITALS — BP 136/80 | HR 82 | Temp 98.3°F | Ht 65.0 in | Wt 196.0 lb

## 2020-03-31 DIAGNOSIS — Z0000 Encounter for general adult medical examination without abnormal findings: Secondary | ICD-10-CM | POA: Diagnosis not present

## 2020-03-31 DIAGNOSIS — E7800 Pure hypercholesterolemia, unspecified: Secondary | ICD-10-CM | POA: Diagnosis not present

## 2020-03-31 NOTE — Progress Notes (Signed)
Subjective:    Patient ID: Shelia Smith, female    DOB: Mar 23, 1956, 64 y.o.   MRN: [id number]  HPI Patient presents for yearly preventative medicine examination. She is a pleasant 64 year old female who  has a past medical history of Anemia, Hyperlipidemia, and OSA (obstructive sleep apnea).   Hyperlipidemia -takes omega-3 fish oil.  She has been trialed on fenofibrate and statins in the past but is unable to take these due to both of them causing elevated liver enzymes.  Was also referred over to lipid clinic due to concern of familial hyperlipidemia, last visit in February 2021.  She did have a CT cardiac scoring done and her coronary calcium score was 0.  She has not followed up since Lab Results  Component Value Date   CHOL 301 (H) 03/28/2019   HDL 81.70 03/28/2019   LDLCALC 196 (H) 03/28/2019   LDLDIRECT 189.9 04/03/2012   TRIG 118.0 03/28/2019   CHOLHDL 4 03/28/2019    All immunizations and health maintenance protocols were reviewed with the patient and needed orders were placed.  Appropriate screening laboratory values were ordered for the patient including screening of hyperlipidemia, renal function and hepatic function.  Medication reconciliation,  past medical history, social history, problem list and allergies were reviewed in detail with the patient  Goals were established with regard to weight loss, exercise, and  diet in compliance with medications. She is very active with exercise and eats healthy.   Wt Readings from Last 3 Encounters:  03/31/20 196 lb (88.9 kg)  06/02/19 195 lb 6.4 oz (88.6 kg)  03/28/19 195 lb (88.5 kg)    Review of Systems  Constitutional: Negative.   HENT: Negative.   Eyes: Negative.   Respiratory: Negative.   Cardiovascular: Negative.   Gastrointestinal: Negative.   Endocrine: Negative.   Genitourinary: Negative.   Musculoskeletal: Negative.   Skin: Negative.   Allergic/Immunologic: Negative.   Neurological: Negative.    Hematological: Negative.   Psychiatric/Behavioral: Negative.    Past Medical History:  Diagnosis Date  . Anemia    no longer an issue since hysterectomy  . Hyperlipidemia    diet controlled  . OSA (obstructive sleep apnea)    no c-pap used    Social History   Socioeconomic History  . Marital status: Married    Spouse name: Not on file  . Number of children: 2  . Years of education: Not on file  . Highest education level: Not on file  Occupational History  . Not on file  Tobacco Use  . Smoking status: Former Research scientist (life sciences)  . Smokeless tobacco: Never Used  Vaping Use  . Vaping Use: Never used  Substance and Sexual Activity  . Alcohol use: No    Alcohol/week: 0.0 standard drinks  . Drug use: No  . Sexual activity: Yes  Other Topics Concern  . 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 Difficulty of Paying Living Expenses: Not on file  Food Insecurity:   . Worried About Charity fundraiser in the Last Year: Not on file  . Ran Out of Food in the Last Year: Not on file  Transportation Needs:   . Lack of Transportation (Medical): Not on file  .  Lack of Transportation (Non-Medical): Not on file  Physical Activity:   . Days of Exercise per Week: Not on file  . Minutes of Exercise per Session: Not on file  Stress:   . Feeling of Stress : Not on file  Social Connections:   . Frequency of Communication with Friends and Family: Not on file  . Frequency of Social Gatherings with Friends and Family: Not on file  . Attends Religious Services: Not on file  . Active Member of Clubs or Organizations: Not on file  . Attends Archivist Meetings: Not on file  . Marital Status: Not on file  Intimate Partner Violence:   . Fear of Current or Ex-Partner: Not on  file  . Emotionally Abused: Not on file  . Physically Abused: Not on file  . Sexually Abused: Not on file    Past Surgical History:  Procedure Laterality Date  . ABDOMINAL HYSTERECTOMY     still has ovaries  . CESAREAN SECTION  [id number], 1993,   x 3  . COLONOSCOPY    . MYOMECTOMY      Family History  Problem Relation Age of Onset  . Stroke Brother   . Stroke Other   . Hypertension Mother   . Diabetes Mother        ?  Marland Kitchen Hypertension Father   . Prostate cancer Father   . Hypertension Brother     Allergies  Allergen Reactions  . Fenofibrate     Elevated Lft  . Statins     Elevated LFTS    Current Outpatient Medications on File Prior to Visit  Medication Sig Dispense Refill  . calcium-vitamin D 250-100 MG-UNIT per tablet Take 1 tablet by mouth daily.      Marland Kitchen co-enzyme Q-10 30 MG capsule Take 30 mg by mouth daily.    . Omega-3 Fatty Acids (OMEGA-3 PO) Take 1,360 mg by mouth daily.    . clotrimazole-betamethasone (LOTRISONE) cream  (Patient not taking: Reported on 03/31/2020)     No current facility-administered medications on file prior to visit.    BP 136/80   Pulse 82   Temp 98.3 F (36.8 C) (Oral)   Ht $R'5\' 5"'sX$  (1.651 m)   Wt 196 lb (88.9 kg)   SpO2 96%   BMI 32.62 kg/m       Objective:   Physical Exam Vitals and nursing note reviewed.  Constitutional:      General: She is not in acute distress.    Appearance: Normal appearance. She is well-developed. She is not ill-appearing.  HENT:     Head: Normocephalic and atraumatic.     Right Ear: Tympanic membrane, ear canal and external ear normal. There is no impacted cerumen.     Left Ear: Tympanic membrane, ear canal and external ear normal. There is no impacted cerumen.     Nose: Nose normal. No congestion or rhinorrhea.     Mouth/Throat:     Mouth: Mucous membranes are moist.     Pharynx: Oropharynx is clear. No oropharyngeal exudate or posterior oropharyngeal erythema.  Eyes:     General:        Right  eye: No discharge.        Left eye: No discharge.     Extraocular Movements: Extraocular movements intact.     Conjunctiva/sclera: Conjunctivae normal.     Pupils: Pupils are equal, round, and reactive to light.  Neck:     Thyroid: No thyromegaly.     Vascular: No  carotid bruit.     Trachea: No tracheal deviation.  Cardiovascular:     Rate and Rhythm: Normal rate and regular rhythm.     Pulses: Normal pulses.     Heart sounds: Normal heart sounds. No murmur heard.  No friction rub. No gallop.   Pulmonary:     Effort: Pulmonary effort is normal. No respiratory distress.     Breath sounds: Normal breath sounds. No stridor. No wheezing, rhonchi or rales.  Chest:     Chest wall: No tenderness.  Abdominal:     General: Abdomen is flat. Bowel sounds are normal. There is no distension.     Palpations: Abdomen is soft. There is no mass.     Tenderness: There is no abdominal tenderness. There is no right CVA tenderness, left CVA tenderness, guarding or rebound.     Hernia: No hernia is present.  Musculoskeletal:        General: No swelling, tenderness, deformity or signs of injury. Normal range of motion.     Cervical back: Normal range of motion and neck supple.     Right lower leg: No edema.     Left lower leg: No edema.  Lymphadenopathy:     Cervical: No cervical adenopathy.  Skin:    General: Skin is warm and dry.     Coloration: Skin is not jaundiced or pale.     Findings: No bruising, erythema, lesion or rash.  Neurological:     General: No focal deficit present.     Mental Status: She is alert and oriented to person, place, and time.     Cranial Nerves: No cranial nerve deficit.     Sensory: No sensory deficit.     Motor: No weakness.     Coordination: Coordination normal.     Gait: Gait normal.     Deep Tendon Reflexes: Reflexes normal.  Psychiatric:        Mood and Affect: Mood normal.        Behavior: Behavior normal.        Thought Content: Thought content normal.         Judgment: Judgment normal.       Assessment & Plan:  1. Routine general medical examination at a health care facility - needs to schedule her GYN visit - Continue to eat healthy and exercise - Follow up in one year or sooner if needed - CBC with Differential/Platelet; Future - Hemoglobin A1c; Future - Lipid panel; Future - TSH; Future - CMP with eGFR([facility]); Future  2. Pure hypercholesterolemia - Encouraged to follow up with lipid clinic  - CBC with Differential/Platelet; Future - Hemoglobin A1c; Future - Lipid panel; Future - TSH; Future - CMP with eGFR([facility]); Future   Dorothyann Peng, NP

## 2020-03-31 NOTE — Addendum Note (Signed)
Addended by: Lerry Liner on: 03/31/2020 08:50 AM   Modules accepted: Orders

## 2020-03-31 NOTE — Patient Instructions (Signed)
It was great seeing you today   We will follow up with you regarding your blood work   Please follow up with the lipid clinic

## 2020-04-01 LAB — COMPLETE METABOLIC PANEL WITH GFR
AG Ratio: 1.7 (calc) (ref 1.0–2.5)
ALT: 29 U/L (ref 6–29)
AST: 31 U/L (ref 10–35)
Albumin: 4.4 g/dL (ref 3.6–5.1)
Alkaline phosphatase (APISO): 77 U/L (ref 37–153)
BUN: 10 mg/dL (ref 7–25)
CO2: 31 mmol/L (ref 20–32)
Calcium: 10.1 mg/dL (ref 8.6–10.4)
Chloride: 104 mmol/L (ref 98–110)
Creat: 0.93 mg/dL (ref 0.50–0.99)
GFR, Est African American: 76 mL/min/{1.73_m2} (ref >=60)
GFR, Est Non African American: 66 mL/min/{1.73_m2} (ref >=60)
Globulin: 2.6 g/dL (calc) (ref 1.9–3.7)
Glucose, Bld: 106 mg/dL — ABNORMAL HIGH (ref 65–99)
Potassium: 5 mmol/L (ref 3.5–5.3)
Sodium: 142 mmol/L (ref 135–146)
Total Bilirubin: 0.4 mg/dL (ref 0.2–1.2)
Total Protein: 7 g/dL (ref 6.1–8.1)

## 2020-04-01 LAB — CBC WITH DIFFERENTIAL/PLATELET
Absolute Monocytes: 189 cells/uL — ABNORMAL LOW (ref 200–950)
Basophils Absolute: 19 cells/uL (ref 0–200)
Basophils Relative: 0.6 %
Eosinophils Absolute: 59 cells/uL (ref 15–500)
Eosinophils Relative: 1.9 %
HCT: 41.5 % (ref 35.0–45.0)
Hemoglobin: 13.8 g/dL (ref 11.7–15.5)
Lymphs Abs: 1383 cells/uL (ref 850–3900)
MCH: 29.1 pg (ref 27.0–33.0)
MCHC: 33.3 g/dL (ref 32.0–36.0)
MCV: 87.6 fL (ref 80.0–100.0)
MPV: 10.5 fL (ref 7.5–12.5)
Monocytes Relative: 6.1 %
Neutro Abs: 1451 cells/uL — ABNORMAL LOW (ref 1500–7800)
Neutrophils Relative %: 46.8 %
Platelets: 251 10*3/uL (ref 140–400)
RBC: 4.74 10*6/uL (ref 3.80–5.10)
RDW: 13.2 % (ref 11.0–15.0)
Total Lymphocyte: 44.6 %
WBC: 3.1 10*3/uL — ABNORMAL LOW (ref 3.8–10.8)

## 2020-04-01 LAB — HEMOGLOBIN A1C
Hgb A1c MFr Bld: 6 % of total Hgb — ABNORMAL HIGH (ref <5.7)
Mean Plasma Glucose: 126 mg/dL
eAG (mmol/L): 7 mmol/L

## 2020-04-01 LAB — LIPID PANEL
Cholesterol: 297 mg/dL — ABNORMAL HIGH (ref <200)
HDL: 87 mg/dL (ref >=50)
LDL Cholesterol (Calc): 188 mg/dL (calc) — ABNORMAL HIGH
Non-HDL Cholesterol (Calc): 210 mg/dL (calc) — ABNORMAL HIGH (ref <130)
Total CHOL/HDL Ratio: 3.4 (calc) (ref <5.0)
Triglycerides: 100 mg/dL (ref <150)

## 2020-04-01 LAB — TSH: TSH: 2.48 mIU/L (ref 0.40–4.50)

## 2020-05-04 DIAGNOSIS — Z01419 Encounter for gynecological examination (general) (routine) without abnormal findings: Secondary | ICD-10-CM | POA: Diagnosis not present

## 2020-05-04 DIAGNOSIS — Z1389 Encounter for screening for other disorder: Secondary | ICD-10-CM | POA: Diagnosis not present

## 2020-05-04 DIAGNOSIS — Z6832 Body mass index (BMI) 32.0-32.9, adult: Secondary | ICD-10-CM | POA: Diagnosis not present

## 2020-05-04 DIAGNOSIS — Z130 Encounter for screening for diseases of the blood and blood-forming organs and certain disorders involving the immune mechanism: Secondary | ICD-10-CM | POA: Diagnosis not present

## 2020-10-27 DIAGNOSIS — M25551 Pain in right hip: Secondary | ICD-10-CM | POA: Diagnosis not present

## 2020-11-25 DIAGNOSIS — Z1231 Encounter for screening mammogram for malignant neoplasm of breast: Secondary | ICD-10-CM | POA: Diagnosis not present

## 2020-11-25 LAB — HM MAMMOGRAPHY

## 2020-12-06 ENCOUNTER — Encounter: Payer: Self-pay | Admitting: Adult Health

## 2021-03-28 DIAGNOSIS — M79644 Pain in right finger(s): Secondary | ICD-10-CM | POA: Diagnosis not present

## 2021-03-31 DIAGNOSIS — M20011 Mallet finger of right finger(s): Secondary | ICD-10-CM | POA: Diagnosis not present

## 2021-04-05 ENCOUNTER — Encounter: Payer: Self-pay | Admitting: Adult Health

## 2021-04-05 ENCOUNTER — Ambulatory Visit (INDEPENDENT_AMBULATORY_CARE_PROVIDER_SITE_OTHER): Payer: 59 | Admitting: Adult Health

## 2021-04-05 VITALS — BP 130/86 | HR 78 | Temp 98.5°F | Ht 64.25 in | Wt 196.0 lb

## 2021-04-05 DIAGNOSIS — Z23 Encounter for immunization: Secondary | ICD-10-CM

## 2021-04-05 DIAGNOSIS — Z0000 Encounter for general adult medical examination without abnormal findings: Secondary | ICD-10-CM | POA: Diagnosis not present

## 2021-04-05 DIAGNOSIS — E7800 Pure hypercholesterolemia, unspecified: Secondary | ICD-10-CM

## 2021-04-05 DIAGNOSIS — R7303 Prediabetes: Secondary | ICD-10-CM

## 2021-04-05 LAB — CBC WITH DIFFERENTIAL/PLATELET
Basophils Absolute: 0 10*3/uL (ref 0.0–0.1)
Basophils Relative: 0.6 % (ref 0.0–3.0)
Eosinophils Absolute: 0.1 10*3/uL (ref 0.0–0.7)
Eosinophils Relative: 1.8 % (ref 0.0–5.0)
HCT: 42.2 % (ref 36.0–46.0)
Hemoglobin: 13.9 g/dL (ref 12.0–15.0)
Lymphocytes Relative: 46.5 % — ABNORMAL HIGH (ref 12.0–46.0)
Lymphs Abs: 1.8 10*3/uL (ref 0.7–4.0)
MCHC: 33 g/dL (ref 30.0–36.0)
MCV: 87.5 fl (ref 78.0–100.0)
Monocytes Absolute: 0.3 10*3/uL (ref 0.1–1.0)
Monocytes Relative: 6.9 % (ref 3.0–12.0)
Neutro Abs: 1.7 10*3/uL (ref 1.4–7.7)
Neutrophils Relative %: 44.2 % (ref 43.0–77.0)
Platelets: 262 10*3/uL (ref 150.0–400.0)
RBC: 4.82 Mil/uL (ref 3.87–5.11)
RDW: 13.2 % (ref 11.5–15.5)
WBC: 3.9 10*3/uL — ABNORMAL LOW (ref 4.0–10.5)

## 2021-04-05 LAB — HEMOGLOBIN A1C: Hgb A1c MFr Bld: 6.3 % (ref 4.6–6.5)

## 2021-04-05 LAB — COMPREHENSIVE METABOLIC PANEL
ALT: 35 U/L (ref 0–35)
AST: 29 U/L (ref 0–37)
Albumin: 4.4 g/dL (ref 3.5–5.2)
Alkaline Phosphatase: 84 U/L (ref 39–117)
BUN: 9 mg/dL (ref 6–23)
CO2: 30 mEq/L (ref 19–32)
Calcium: 10 mg/dL (ref 8.4–10.5)
Chloride: 103 mEq/L (ref 96–112)
Creatinine, Ser: 0.93 mg/dL (ref 0.40–1.20)
GFR: 65.29 mL/min (ref >60.00)
Glucose, Bld: 94 mg/dL (ref 70–99)
Potassium: 4.7 mEq/L (ref 3.5–5.1)
Sodium: 139 mEq/L (ref 135–145)
Total Bilirubin: 0.4 mg/dL (ref 0.2–1.2)
Total Protein: 7.2 g/dL (ref 6.0–8.3)

## 2021-04-05 LAB — LIPID PANEL
Cholesterol: 294 mg/dL — ABNORMAL HIGH (ref 0–200)
HDL: 84.2 mg/dL (ref >39.00)
LDL Cholesterol: 189 mg/dL — ABNORMAL HIGH (ref 0–99)
NonHDL: 210.28
Total CHOL/HDL Ratio: 3
Triglycerides: 108 mg/dL (ref 0.0–149.0)
VLDL: 21.6 mg/dL (ref 0.0–40.0)

## 2021-04-05 LAB — TSH: TSH: 1.24 u[IU]/mL (ref 0.35–5.50)

## 2021-04-05 NOTE — Progress Notes (Signed)
Subjective:    Patient ID: Shelia Smith, female    DOB: 16-Feb-1956, 65 y.o.   MRN: [id number]  HPI Patient presents for yearly preventative medicine examination. She is a pleasant 65 year old female who  has a past medical history of Anemia, Hyperlipidemia, and OSA (obstructive sleep apnea).  Hyperlipidemia -takes omega-3 fish oil.  In the past she has been trialed on fenofibrate and statins but was unable to take these due to elevation in her liver enzymes.  She has been referred over to the lipid clinic due to concern of familial hyperlipidemia.  Had a CT cardiac scoring done and her coronary calcium score was 0.  Her initial appointment was in February 2021.  She has not followed up since. She plans on scheduling an appointment.  Lab Results  Component Value Date   CHOL 297 (H) 03/31/2020   HDL 87 03/31/2020   LDLCALC 188 (H) 03/31/2020   LDLDIRECT 189.9 04/03/2012   TRIG 100 03/31/2020   CHOLHDL 3.4 03/31/2020     All immunizations and health maintenance protocols were reviewed with the patient and needed orders were placed. Refused PNA and flu. Will do shingles vaccination today   Appropriate screening laboratory values were ordered for the patient including screening of hyperlipidemia, renal function and hepatic function.  Medication reconciliation,  past medical history, social history, problem list and allergies were reviewed in detail with the patient  Goals were established with regard to weight loss, exercise, and  diet in compliance with medications. She is exercising 5-6 days a week and tries to eat healthy  Wt Readings from Last 3 Encounters:  04/05/21 196 lb (88.9 kg)  03/31/20 196 lb (88.9 kg)  06/02/19 195 lb 6.4 oz (88.6 kg)   She is up to date on routine GYN, Mammogram and colon cancer screening.   She has no acute issues today  Review of Systems  Constitutional: Negative.   HENT: Negative.    Eyes: Negative.   Respiratory: Negative.    Cardiovascular:  Negative.   Gastrointestinal: Negative.   Endocrine: Negative.   Genitourinary: Negative.   Musculoskeletal: Negative.   Skin: Negative.   Allergic/Immunologic: Negative.   Neurological: Negative.   Hematological: Negative.   Psychiatric/Behavioral: Negative.     Past Medical History:  Diagnosis Date   Anemia    no longer an issue since hysterectomy   Hyperlipidemia    diet controlled   OSA (obstructive sleep apnea)    no c-pap used    Social History   Socioeconomic History   Marital status: Married    Spouse name: Not on file   Number of children: 2   Years of education: Not on file   Highest education level: Not on file  Occupational History   Not on file  Tobacco Use   Smoking status: Former   Smokeless tobacco: Never  Vaping Use   Vaping Use: Never used  Substance and Sexual Activity   Alcohol use: No    Alcohol/week: 0.0 standard drinks   Drug use: No   Sexual activity: Yes  Other Topics Concern   Not on file  Social History Narrative   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   Married for 11 years    2 children both live in [place]   She enjoys painting, reading, Curator, and likes outdoors   Social Determinants of Corporate investment banker Strain: Not on file  Food Insecurity: Not on file  Transportation Needs: Not on file  Physical Activity: Not on file  Stress: Not on file  Social Connections: Not on file  Intimate Partner Violence: Not on file    Past Surgical History:  Procedure Laterality Date   ABDOMINAL HYSTERECTOMY     still has ovaries   CESAREAN SECTION  [id number], 1993,   x 3   COLONOSCOPY     MYOMECTOMY      Family History  Problem Relation Age of Onset   Stroke Brother    Stroke Other    Hypertension Mother    Diabetes Mother        ?   Hypertension Father    Prostate cancer Father    Hypertension Brother     Allergies  Allergen Reactions   Fenofibrate     Elevated Lft   Statins     Elevated LFTS    Current  Outpatient Medications on File Prior to Visit  Medication Sig Dispense Refill   calcium-vitamin D 250-100 MG-UNIT per tablet Take 1 tablet by mouth daily.       clotrimazole-betamethasone (LOTRISONE) cream      co-enzyme Q-10 30 MG capsule Take 30 mg by mouth daily.     Omega-3 Fatty Acids (OMEGA-3 PO) Take 1,360 mg by mouth daily.     No current facility-administered medications on file prior to visit.    BP 130/86   Pulse 78   Temp 98.5 F (36.9 C) (Oral)   Ht 5' 4.25" (1.632 m)   Wt 196 lb (88.9 kg)   SpO2 97%   BMI 33.38 kg/m        Objective:   Physical Exam Vitals and nursing note reviewed.  Constitutional:      General: She is not in acute distress.    Appearance: Normal appearance. She is well-developed. She is obese. She is not ill-appearing.  HENT:     Head: Normocephalic and atraumatic.     Right Ear: Tympanic membrane, ear canal and external ear normal. There is no impacted cerumen.     Left Ear: Tympanic membrane, ear canal and external ear normal. There is no impacted cerumen.     Nose: Nose normal. No congestion or rhinorrhea.     Mouth/Throat:     Mouth: Mucous membranes are moist.     Pharynx: Oropharynx is clear. No oropharyngeal exudate or posterior oropharyngeal erythema.  Eyes:     General:        Right eye: No discharge.        Left eye: No discharge.     Extraocular Movements: Extraocular movements intact.     Conjunctiva/sclera: Conjunctivae normal.     Pupils: Pupils are equal, round, and reactive to light.  Neck:     Thyroid: No thyromegaly.     Vascular: No carotid bruit.     Trachea: No tracheal deviation.  Cardiovascular:     Rate and Rhythm: Normal rate and regular rhythm.     Pulses: Normal pulses.     Heart sounds: Normal heart sounds. No murmur heard.   No friction rub. No gallop.  Pulmonary:     Effort: Pulmonary effort is normal. No respiratory distress.     Breath sounds: Normal breath sounds. No stridor. No wheezing, rhonchi  or rales.  Chest:     Chest wall: No tenderness.  Abdominal:     General: Abdomen is flat. Bowel sounds are normal. There is no distension.     Palpations: Abdomen is soft. There is no mass.  Tenderness: There is no abdominal tenderness. There is no right CVA tenderness, left CVA tenderness, guarding or rebound.     Hernia: No hernia is present.  Musculoskeletal:        General: No swelling, tenderness, deformity or signs of injury. Normal range of motion.     Cervical back: Normal range of motion and neck supple.     Right lower leg: No edema.     Left lower leg: No edema.  Lymphadenopathy:     Cervical: No cervical adenopathy.  Skin:    General: Skin is warm and dry.     Coloration: Skin is not jaundiced or pale.     Findings: No bruising, erythema, lesion or rash.  Neurological:     General: No focal deficit present.     Mental Status: She is alert and oriented to person, place, and time.     Cranial Nerves: No cranial nerve deficit.     Sensory: No sensory deficit.     Motor: No weakness.     Coordination: Coordination normal.     Gait: Gait normal.     Deep Tendon Reflexes: Reflexes normal.  Psychiatric:        Mood and Affect: Mood normal.        Behavior: Behavior normal.        Thought Content: Thought content normal.        Judgment: Judgment normal.      Assessment & Plan:  1. Routine general medical examination at a health care facility - Continue to exercise and eat healthy  - Follow up in one year or sooner if needed - CBC with Differential/Platelet; Future - Comprehensive metabolic panel; Future - Hemoglobin A1c; Future - Lipid panel; Future - TSH; Future  2. Pure hypercholesterolemia - needs to follow up with lipid clinic  - CBC with Differential/Platelet; Future - Comprehensive metabolic panel; Future - Hemoglobin A1c; Future - Lipid panel; Future - TSH; Future  3. Pre-diabetes - Consider metformin  - CBC with Differential/Platelet; Future -  Comprehensive metabolic panel; Future - Hemoglobin A1c; Future - Lipid panel; Future - TSH; Future  4. Need for shingles vaccine  - Varicella-zoster vaccine IM   Shirline Frees, NP

## 2021-04-05 NOTE — Patient Instructions (Signed)
It was great seeing you today   We will follow up with you regarding your lab work   Please let me know if you need anything   

## 2021-04-06 MED ORDER — METFORMIN HCL 500 MG PO TABS: 500.0000 mg | ORAL_TABLET | Freq: Every day | ORAL | 0 refills | Status: DC

## 2021-04-06 NOTE — Addendum Note (Signed)
Addended by: Christy Sartorius on: 04/06/2021 10:40 AM   Modules accepted: Orders

## 2021-04-06 NOTE — Telephone Encounter (Signed)
FYI

## 2021-04-19 ENCOUNTER — Encounter: Payer: Self-pay | Admitting: Adult Health

## 2021-05-09 DIAGNOSIS — Z01419 Encounter for gynecological examination (general) (routine) without abnormal findings: Secondary | ICD-10-CM | POA: Diagnosis not present

## 2021-05-09 DIAGNOSIS — Z1389 Encounter for screening for other disorder: Secondary | ICD-10-CM | POA: Diagnosis not present

## 2021-05-09 DIAGNOSIS — Z130 Encounter for screening for diseases of the blood and blood-forming organs and certain disorders involving the immune mechanism: Secondary | ICD-10-CM | POA: Diagnosis not present

## 2021-05-09 DIAGNOSIS — Z6831 Body mass index (BMI) 31.0-31.9, adult: Secondary | ICD-10-CM | POA: Diagnosis not present

## 2021-07-05 ENCOUNTER — Other Ambulatory Visit: Payer: Self-pay

## 2021-07-05 ENCOUNTER — Ambulatory Visit (INDEPENDENT_AMBULATORY_CARE_PROVIDER_SITE_OTHER): Payer: 59 | Admitting: Adult Health

## 2021-07-05 ENCOUNTER — Encounter: Payer: Self-pay | Admitting: Adult Health

## 2021-07-05 ENCOUNTER — Ambulatory Visit: Payer: 59

## 2021-07-05 VITALS — BP 128/82 | HR 71 | Temp 98.6°F | Ht 64.25 in | Wt 185.0 lb

## 2021-07-05 DIAGNOSIS — R7303 Prediabetes: Secondary | ICD-10-CM | POA: Diagnosis not present

## 2021-07-05 DIAGNOSIS — Z23 Encounter for immunization: Secondary | ICD-10-CM

## 2021-07-05 NOTE — Progress Notes (Signed)
? ?Subjective:  ? ? Patient ID: Shelia Smith, female    DOB: 01/15/1956, 66 y.o.   MRN: [id number] ? ?HPI ? ?66 year old female who  has a past medical history of Anemia, Hyperlipidemia, and OSA (obstructive sleep apnea). ? ?She presents to the office today for follow-up regarding prediabetes.  During her routine CPE it was noted that her A1c had increased up to 6.3.  He was encouraged to start metformin and work on lifestyle modifications.  She wanted to work on lifestyle modifications first ? ?Today she reports that she has been really working on diet, has been cutting back on sugars and carbs. She has been do aerobic exercise for 30 minutes a day.  ? ?Lab Results  ?Component Value Date  ? HGBA1C 6.3 04/05/2021  ? ?Wt Readings from Last 3 Encounters:  ?07/05/21 185 lb (83.9 kg)  ?04/05/21 196 lb (88.9 kg)  ?03/31/20 196 lb (88.9 kg)  ? ?Review of Systems ?See HPI  ? ?Past Medical History:  ?Diagnosis Date  ? Anemia   ? no longer an issue since hysterectomy  ? Hyperlipidemia   ? diet controlled  ? OSA (obstructive sleep apnea)   ? no c-pap used  ? ? ?Social History  ? ?Socioeconomic History  ? Marital status: Married  ?  Spouse name: Not on file  ? Number of children: 2  ? Years of education: Not on file  ? Highest education level: Not on file  ?Occupational History  ? Not on file  ?Tobacco Use  ? Smoking status: Former  ? Smokeless tobacco: Never  ?Vaping Use  ? Vaping Use: Never used  ?Substance and Sexual Activity  ? Alcohol use: No  ?  Alcohol/week: 0.0 standard drinks  ? Drug use: No  ? Sexual activity: Yes  ?Other Topics Concern  ? Not on file  ?Social History Narrative  ? 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 Married for 11 years   ? 2 children both live in [place]  ? She enjoys painting, reading, poetry, and likes outdoors  ? ?Social Determinants of Health  ? ?Financial Resource Strain: Unknown  ? Difficulty of Paying Living Expenses: Patient refused  ?Food Insecurity: Unknown  ? Worried About Patent examiner in the Last Year: Patient refused  ? Ran Out of Food in the Last Year: Patient refused  ?Transportation Needs: Unknown  ? Lack of Transportation (Medical): Patient refused  ? Lack of Transportation (Non-Medical): Patient refused  ?Physical Activity: Sufficiently Active  ? Days of Exercise per Week: 5 days  ? Minutes of Exercise per Session: 40 min  ?Stress: No Stress Concern Present  ? Feeling of Stress : Not at all  ?Social Connections: Unknown  ? Frequency of Communication with Friends and Family: Patient refused  ? Frequency of Social Gatherings with Friends and Family: Patient refused  ? Attends Religious Services: Patient refused  ? Active Member of Clubs or Organizations: Patient refused  ? Attends Banker Meetings: Not on file  ? Marital Status: Married  ?Intimate Partner Violence: Not on file  ? ? ?Past Surgical History:  ?Procedure Laterality Date  ? ABDOMINAL HYSTERECTOMY    ? still has ovaries  ? CESAREAN SECTION  [id number], 1993,  ? x 3  ? COLONOSCOPY    ? MYOMECTOMY    ? ? ?Family History  ?Problem Relation Age of Onset  ? Stroke Brother   ? Stroke Other   ? Hypertension Mother   ?  Diabetes Mother   ?     ?  ? Hypertension Father   ? Prostate cancer Father   ? Hypertension Brother   ? ? ?Allergies  ?Allergen Reactions  ? Fenofibrate   ?  Elevated Lft  ? Statins   ?  Elevated LFTS  ? ? ?Current Outpatient Medications on File Prior to Visit  ?Medication Sig Dispense Refill  ? clotrimazole-betamethasone (LOTRISONE) cream Prn    ? ?No current facility-administered medications on file prior to visit.  ? ? ?BP 128/82   Pulse 71   Temp 98.6 ?F (37 ?C) (Oral)   Ht 5' 4.25" (1.632 m)   Wt 185 lb (83.9 kg)   SpO2 96%   BMI 31.51 kg/m?  ? ? ?   ?Objective:  ? Physical Exam ?Vitals and nursing note reviewed.  ?Constitutional:   ?   Appearance: Normal appearance.  ?Skin: ?   General: Skin is warm and dry.  ?   Capillary Refill: Capillary refill takes less than 2 seconds.  ?Neurological:  ?    General: No focal deficit present.  ?   Mental Status: She is alert and oriented to person, place, and time.  ?Psychiatric:     ?   Mood and Affect: Mood normal.     ?   Behavior: Behavior normal.     ?   Thought Content: Thought content normal.     ?   Judgment: Judgment normal.  ? ? ?   ?Assessment & Plan:  ?1. Pre-diabetes ? ?- POCT glycosylated hemoglobin (Hb A1C)- 6.0  ?- Has improved and she has started to lose weight  ?- Congratulated on lifestyle modifications  ?- Follow up in December at CPE or sooner if needed ? ?2. Need for shingles vaccine ? ?- Varicella-zoster vaccine IM ? ? ?Shirline Frees, NP ? ?

## 2021-07-06 LAB — POCT GLYCOSYLATED HEMOGLOBIN (HGB A1C): Hemoglobin A1C: 6 % — AB (ref 4.0–5.6)

## 2021-08-11 ENCOUNTER — Ambulatory Visit (INDEPENDENT_AMBULATORY_CARE_PROVIDER_SITE_OTHER): Payer: 59 | Admitting: Internal Medicine

## 2021-08-11 ENCOUNTER — Telehealth: Payer: Self-pay | Admitting: Internal Medicine

## 2021-08-11 ENCOUNTER — Encounter: Payer: Self-pay | Admitting: Internal Medicine

## 2021-08-11 VITALS — BP 142/81 | HR 74 | Ht 65.0 in | Wt 185.0 lb

## 2021-08-11 DIAGNOSIS — K719 Toxic liver disease, unspecified: Secondary | ICD-10-CM

## 2021-08-11 DIAGNOSIS — T466X5A Adverse effect of antihyperlipidemic and antiarteriosclerotic drugs, initial encounter: Secondary | ICD-10-CM | POA: Diagnosis not present

## 2021-08-11 DIAGNOSIS — E7849 Other hyperlipidemia: Secondary | ICD-10-CM | POA: Diagnosis not present

## 2021-08-11 NOTE — Patient Instructions (Signed)
Medication Instructions:  ?NO CHANGES today  ? ?*If you need a refill on your cardiac medications before your next appointment, please call your pharmacy* ? ? ?Testing/Procedures: ?Genetic Test -- results should be available in 2-3 weeks ?We will reach out with those once Dr. Debara Pickett reviews ? ? ?Follow-Up: ?At [facility], you and your health needs are our priority.  As part of our continuing mission to provide you with exceptional heart care, we have created designated Provider Care Teams.  These Care Teams include your primary Cardiologist (physician) and Advanced Practice Providers (APPs -  Physician Assistants and Nurse Practitioners) who all work together to provide you with the care you need, when you need it. ? ?We recommend signing up for the patient portal called "MyChart".  Sign up information is provided on this After Visit Summary.  MyChart is used to connect with patients for Virtual Visits (Telemedicine).  Patients are able to view lab/test results, encounter notes, upcoming appointments, etc.  Non-urgent messages can be sent to your provider as well.   ?To learn more about what you can do with MyChart, go to [email or web address].   ? ?Your next appointment:   ?Pending genetic test results ?

## 2021-08-11 NOTE — Progress Notes (Signed)
? ? ?LIPID CLINIC CONSULT NOTE ? ?Chief Complaint:  ?Follow-up dyslipidemia ? ?Primary Care Physician: ?Shirline Frees, NP ? ?Primary Cardiologist:  ?None ? ?HPI:  ?Shelia Smith is a 66 y.o. female who is being seen today for the evaluation of dyslipidemia at the request of Shirline Frees, NP.  This is a pleasant 66 year old female who is very physically active.  She exercises regularly.  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  She has never had any heart problems.  She reports that she has had high cholesterol for most of her adult life.  She has been tried on statins before as well as fenofibrate, both raised her liver enzymes.  She said remotely she may have had an ultrasound of her liver when she was seeing Dr. Lovell Sheehan, prior to transitioning over to her current provider.  There is is history of stroke in her brother as well as high blood pressure in her mother and she also had elevated cholesterol.  She has 4 brothers, but she not aware that any of them have high cholesterol.  She denies any chest pain or shortness of breath or any exercise intolerance.  Her lipids are markedly abnormal, total cholesterol 301, triglycerides 118, HDL 81 and LDL 196.  These findings are concerning for familial hyperlipidemia however I wonder if this is a pathogenic familial hyperlipidemia or perhaps mutation in ApoB which in some instances could be nonpathogenic. ? ?08/11/2021 ? ?Ms. Puskas is seen today for follow-up.  I last saw her about 2 years ago.  Which time I had ordered a coronary calcium score.  This came back as 0 which is reassuring.  She subsequently tried to work on diet and had some weight loss actually about 10 pounds.  Despite this, her cholesterol remains elevated, suggesting a genetic cholesterol disorder.  Total cholesterol 294, triglycerides 108, HDL 84 and LDL 189.  Although she has a high HDL cholesterol, the question is whether she is cardioprotective or not.  We discussed possible management  options.  She would like to consider genetic testing which I think is very reasonable. ? ?PMHx:  ?Past Medical History:  ?Diagnosis Date  ? Anemia   ? no longer an issue since hysterectomy  ? Hyperlipidemia   ? diet controlled  ? OSA (obstructive sleep apnea)   ? no c-pap used  ? ? ?Past Surgical History:  ?Procedure Laterality Date  ? ABDOMINAL HYSTERECTOMY    ? still has ovaries  ? CESAREAN SECTION  [id number], 1993,  ? x 3  ? COLONOSCOPY    ? MYOMECTOMY    ? ? ?FAMHx:  ?Family History  ?Problem Relation Age of Onset  ? Stroke Brother   ? Stroke Other   ? Hypertension Mother   ? Diabetes Mother   ?     ?  ? Hypertension Father   ? Prostate cancer Father   ? Hypertension Brother   ? ? ?SOCHx:  ? reports that she has quit smoking. She has never used smokeless tobacco. She reports that she does not drink alcohol and does not use drugs. ? ?ALLERGIES:  ?Allergies  ?Allergen Reactions  ? Fenofibrate   ?  Elevated Lft  ? Statins   ?  Elevated LFTS  ? ? ?ROS: ?Pertinent items noted in HPI and remainder of comprehensive ROS otherwise negative. ? ?HOME MEDS: ?Current Outpatient Medications on File Prior to Visit  ?Medication Sig Dispense Refill  ? clotrimazole-betamethasone (LOTRISONE) cream Prn    ? ?No current  facility-administered medications on file prior to visit.  ? ? ?LABS/IMAGING: ?No results found for this or any previous visit (from the past 48 hour(s)). ?No results found. ? ?LIPID PANEL: ?   ?Component Value Date/Time  ? CHOL 294 (H) 04/05/2021 1146  ? TRIG 108.0 04/05/2021 1146  ? HDL 84.20 04/05/2021 1146  ? CHOLHDL 3 04/05/2021 1146  ? VLDL 21.6 04/05/2021 1146  ? LDLCALC 189 (H) 04/05/2021 1146  ? LDLCALC 188 (H) 03/31/2020 0850  ? LDLDIRECT 189.9 04/03/2012 0835  ? ? ?WEIGHTS: ?Wt Readings from Last 3 Encounters:  ?08/11/21 185 lb (83.9 kg)  ?07/05/21 185 lb (83.9 kg)  ?04/05/21 196 lb (88.9 kg)  ? ? ?VITALS: ?BP (!) 142/81   Pulse 74   Ht 5\' 5"  (1.651 m)   Wt 185 lb (83.9 kg)   SpO2 98%   BMI 30.79  kg/m?  ? ?EXAM: ?Deferred ? ?EKG: ?Deferred ? ?ASSESSMENT: ?Probable familial hyperlipidemia ?Statin intolerance-elevated LFTs ?Fenofibrate intolerance-elevated LFTs ?Zero coronary calcium (05/2019) ? ?PLAN: ?1.   Ms. Osgood has persistently elevated LDL cholesterol with a high HDL cholesterol but likely has a genetic dyslipidemia.  Fortunately she had no coronary calcium.  Her lipids Maxcy be cardioprotective.  We discussed the possibility of genetic testing.  This could provide Gaye Alken more information about her coronary risk.  She is agreeable to that today.  She understands if it is not covered by insurance, then there may be up to $299 fee.  She is agreeable to proceed and we will contact her with those results typically when they are received in about 2 to 3 weeks.  Further management based on those findings. ? ?[place], MD, [facility], FACP  ?[place]  [facility]  ?Medical Director of the Advanced Lipid Disorders &  ?Cardiovascular Risk Reduction Clinic ?Diplomate of the [facility] of Clinical Lipidology ?Attending Cardiologist  ?Direct Dial: [phone]  Fax: [phone]  ?Website:  [email or web address] ? ?[phone] Raynesha Tiedt ?08/11/2021, 3:14 PM ?

## 2021-08-11 NOTE — Telephone Encounter (Signed)
Genetic test for dyslipidemia/ASCVD ordered ([facility]) ?Cheek swab completed in office ?Specimen and necessary paperwork mailed. ?ID: [id number] ? ?

## 2021-09-23 ENCOUNTER — Encounter: Payer: Self-pay | Admitting: Internal Medicine

## 2021-12-05 DIAGNOSIS — Z1231 Encounter for screening mammogram for malignant neoplasm of breast: Secondary | ICD-10-CM | POA: Diagnosis not present

## 2022-01-31 IMAGING — CT CT CARDIAC CORONARY ARTERY CALCIUM SCORE
3 series · 14 of 20 positions shown, 15 images · non-contrast
Comparison: none

Addendum:
HISTORY OF PRESENT ILLNESS:
Risk stratification

EXAM:
Coronary Calcium Score
PROCEDURE:
The patient was scanned on a Siemens Force scanner. Axial
non-contrast 3 mm slices were carried out through the heart. The
data set was analyzed on a dedicated work station and scored using
the Agatson method.

[Series 2: casc 3.0 bv41 2 bestdiast 67 % · axial · 0.37mm/px · z∈[-187,-124]mm · 4 of 37 slices shown, 5 images]
[im 8/37  vessel]
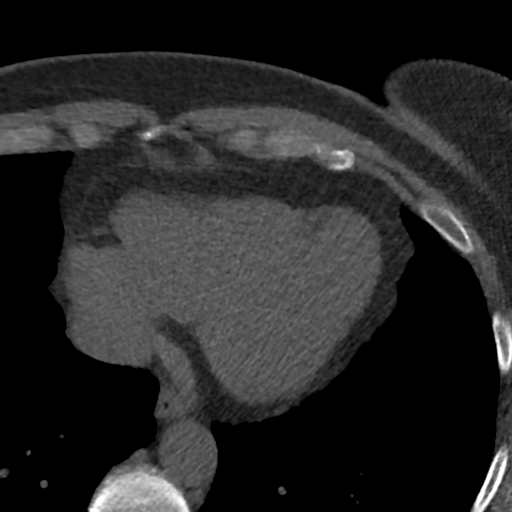
[im 8/37  lung]
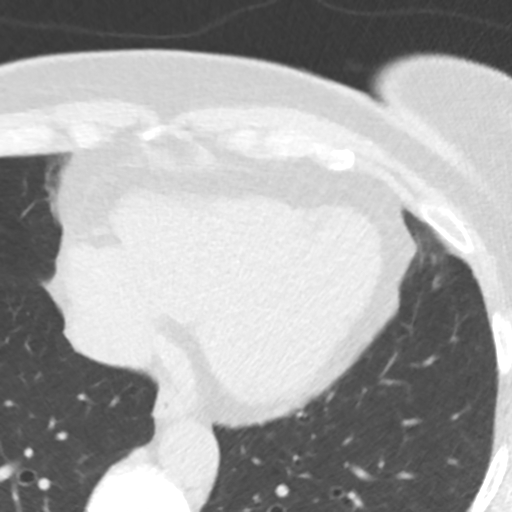
[im 15/37  vessel]
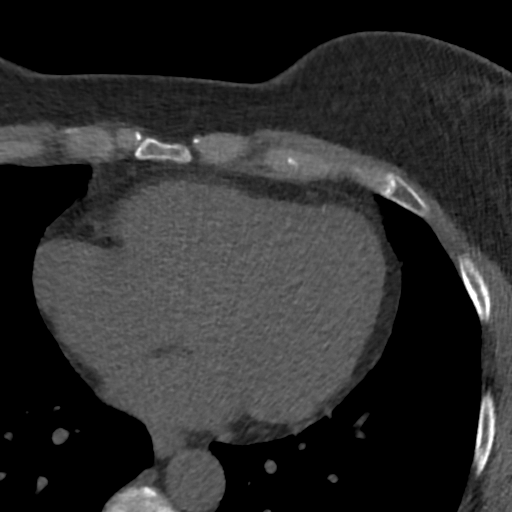
[im 22/37  vessel]
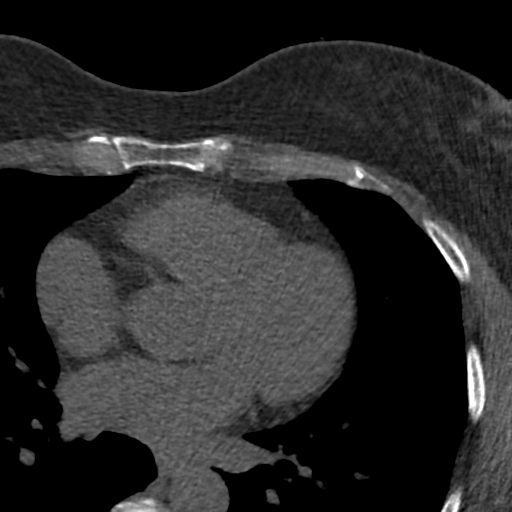
[im 29/37  vessel]
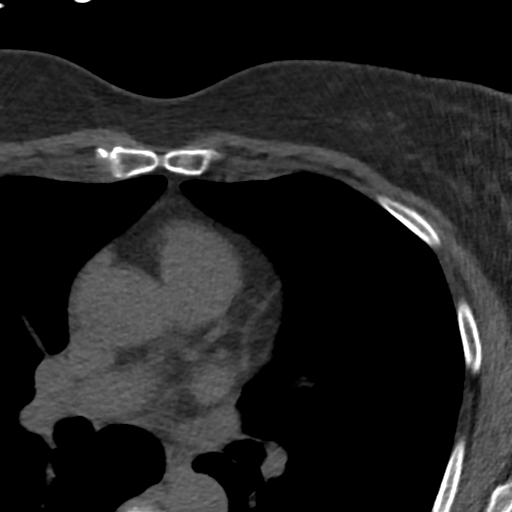

[Series 3: lung 65 % · axial · 0.65mm/px · z∈[-192,-120]mm · 5 of 38 slices shown]
[im 7/38  lung]
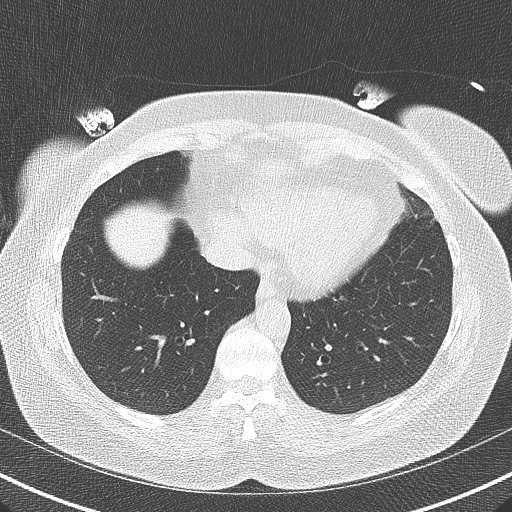
[im 13/38  lung]
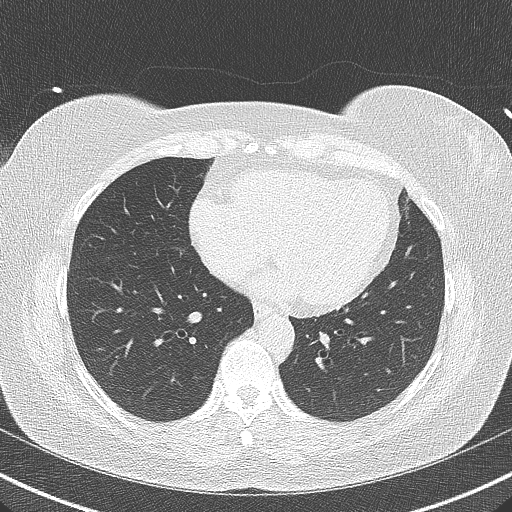
[im 19/38  lung]
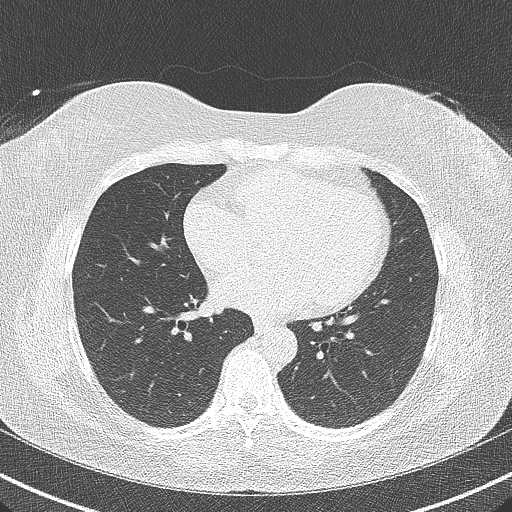
[im 25/38  lung]
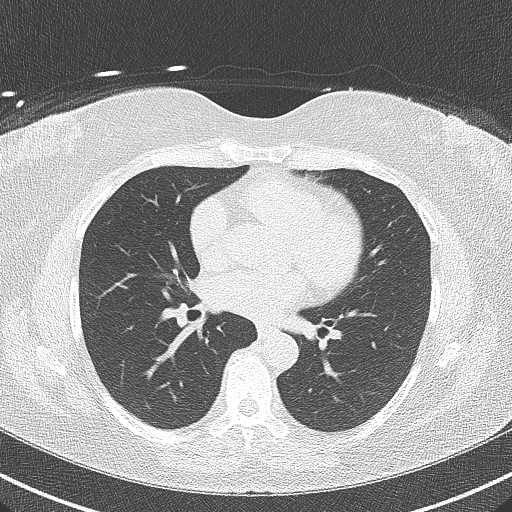
[im 31/38  lung]
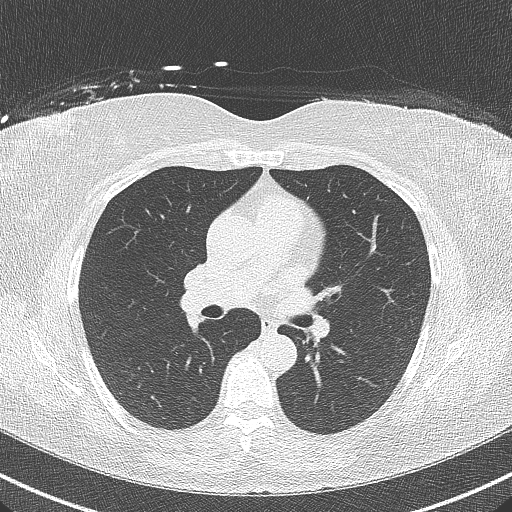

[Series 4: lung st 65 % · axial · 0.65mm/px · z∈[-192,-120]mm · 5 of 38 slices shown]
[im 7/38  lung]
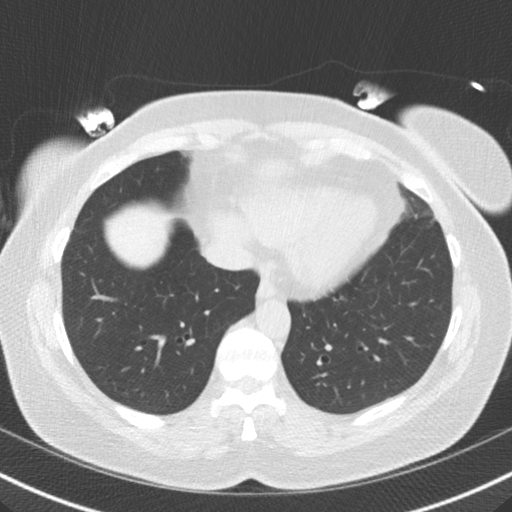
[im 13/38  lung]
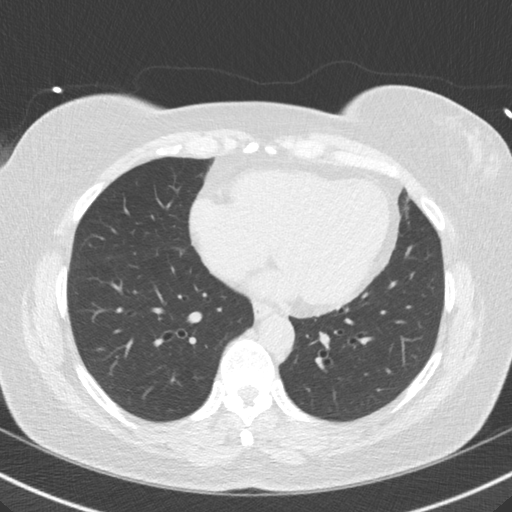
[im 19/38  lung]
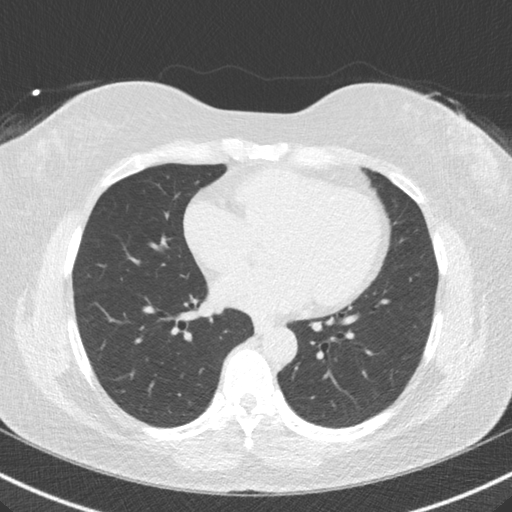
[im 25/38  lung]
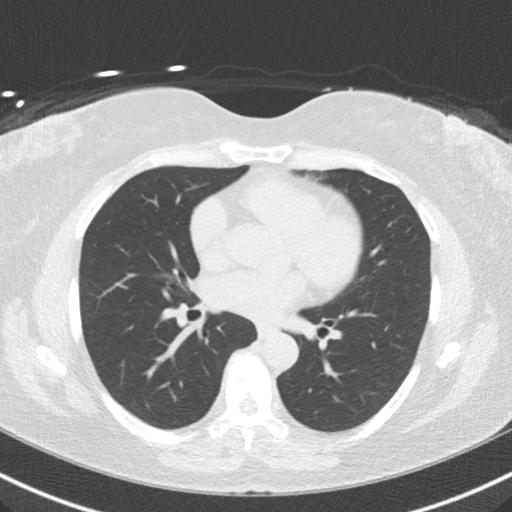
[im 31/38  lung]
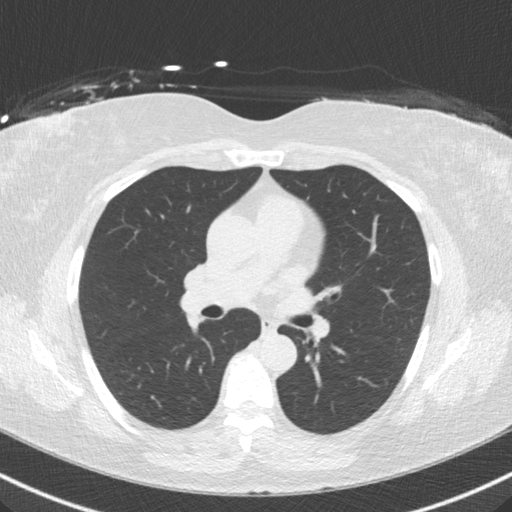

[14 of 20 positions shown; findings below may reference images not displayed]

FINDINGS: Non-cardiac: See separate report from [REDACTED].

Ascending Aorta: Normal.

Pericardium: Normal

Coronary arteries: Normal origin.
IMPRESSION: Coronary calcium score of 0. This was 0 percentile for age and sex
matched control.

*** End of Addendum ***
HISTORY OF PRESENT ILLNESS:
Risk stratification

EXAM:
Coronary Calcium Score

PROCEDURE:
The patient was scanned on a Siemens Force scanner. Axial
non-contrast 3 mm slices were carried out through the heart. The
data set was analyzed on a dedicated work station and scored using
the Agatson method.
FINDINGS: Non-cardiac: See separate report from [REDACTED].

Ascending Aorta: Normal.

Pericardium: Normal

Coronary arteries: Normal origin.
IMPRESSION: Coronary calcium score of 0. This was 0 percentile for age and sex
matched control.

## 2022-06-08 ENCOUNTER — Encounter: Payer: Self-pay | Admitting: Adult Health

## 2022-06-08 ENCOUNTER — Ambulatory Visit (INDEPENDENT_AMBULATORY_CARE_PROVIDER_SITE_OTHER): Payer: 59 | Admitting: Adult Health

## 2022-06-08 VITALS — BP 128/80 | HR 85 | Temp 98.5°F | Ht 63.5 in | Wt 185.0 lb

## 2022-06-08 DIAGNOSIS — E7801 Familial hypercholesterolemia: Secondary | ICD-10-CM | POA: Diagnosis not present

## 2022-06-08 DIAGNOSIS — R7303 Prediabetes: Secondary | ICD-10-CM | POA: Diagnosis not present

## 2022-06-08 DIAGNOSIS — Z0000 Encounter for general adult medical examination without abnormal findings: Secondary | ICD-10-CM

## 2022-06-08 LAB — COMPREHENSIVE METABOLIC PANEL
ALT: 52 U/L — ABNORMAL HIGH (ref 0–35)
AST: 37 U/L (ref 0–37)
Albumin: 4.2 g/dL (ref 3.5–5.2)
Alkaline Phosphatase: 77 U/L (ref 39–117)
BUN: 9 mg/dL (ref 6–23)
CO2: 29 mEq/L (ref 19–32)
Calcium: 9.6 mg/dL (ref 8.4–10.5)
Chloride: 104 mEq/L (ref 96–112)
Creatinine, Ser: 0.89 mg/dL (ref 0.40–1.20)
GFR: 68.26 mL/min (ref >60.00)
Glucose, Bld: 91 mg/dL (ref 70–99)
Potassium: 4.6 mEq/L (ref 3.5–5.1)
Sodium: 141 mEq/L (ref 135–145)
Total Bilirubin: 0.4 mg/dL (ref 0.2–1.2)
Total Protein: 7.1 g/dL (ref 6.0–8.3)

## 2022-06-08 LAB — CBC WITH DIFFERENTIAL/PLATELET
Basophils Absolute: 0 10*3/uL (ref 0.0–0.1)
Basophils Relative: 0.4 % (ref 0.0–3.0)
Eosinophils Absolute: 0 10*3/uL (ref 0.0–0.7)
Eosinophils Relative: 1.1 % (ref 0.0–5.0)
HCT: 41.8 % (ref 36.0–46.0)
Hemoglobin: 13.9 g/dL (ref 12.0–15.0)
Lymphocytes Relative: 45 % (ref 12.0–46.0)
Lymphs Abs: 1.6 10*3/uL (ref 0.7–4.0)
MCHC: 33.2 g/dL (ref 30.0–36.0)
MCV: 87.8 fl (ref 78.0–100.0)
Monocytes Absolute: 0.2 10*3/uL (ref 0.1–1.0)
Monocytes Relative: 5.8 % (ref 3.0–12.0)
Neutro Abs: 1.7 10*3/uL (ref 1.4–7.7)
Neutrophils Relative %: 47.7 % (ref 43.0–77.0)
Platelets: 243 10*3/uL (ref 150.0–400.0)
RBC: 4.77 Mil/uL (ref 3.87–5.11)
RDW: 13.2 % (ref 11.5–15.5)
WBC: 3.6 10*3/uL — ABNORMAL LOW (ref 4.0–10.5)

## 2022-06-08 LAB — LIPID PANEL
Cholesterol: 289 mg/dL — ABNORMAL HIGH (ref 0–200)
HDL: 80.3 mg/dL (ref >39.00)
LDL Cholesterol: 191 mg/dL — ABNORMAL HIGH (ref 0–99)
NonHDL: 208.44
Total CHOL/HDL Ratio: 4
Triglycerides: 86 mg/dL (ref 0.0–149.0)
VLDL: 17.2 mg/dL (ref 0.0–40.0)

## 2022-06-08 LAB — TSH: TSH: 1.24 u[IU]/mL (ref 0.35–5.50)

## 2022-06-08 LAB — HEMOGLOBIN A1C: Hgb A1c MFr Bld: 6.1 % (ref 4.6–6.5)

## 2022-06-08 NOTE — Progress Notes (Signed)
Subjective:    Patient ID: Shelia Smith, female    DOB: [birth year], 67 y.o.   MRN: [id number]  HPI Patient presents for yearly preventative medicine examination. She is a pleasant 67 year old female who  has a past medical history of Anemia, Hyperlipidemia, and OSA (obstructive sleep apnea).  Hyperlipidemia  In the past she has been trialed on fenofibrate and statins but was unable to take these due to elevation in her liver enzymes.She has had genetic testing done and found to have a genetic component to her elevated cholesterol levels. She is interested in starting Repatha and will reach out to Dr. Debara Pickett to get this ordered.  Lab Results  Component Value Date   CHOL 294 (H) 04/05/2021   HDL 84.20 04/05/2021   LDLCALC 189 (H) 04/05/2021   LDLDIRECT 189.9 04/03/2012   TRIG 108.0 04/05/2021   CHOLHDL 3 04/05/2021   Pre-Diabetes - not currently on medications. She has been working on lifestyle modifications. She continues to watch her carbs and sugars. She is still exercising on a routine basis  Lab Results  Component Value Date   HGBA1C 6.0 (A) 07/06/2021    All immunizations and health maintenance protocols were reviewed with the patient and needed orders were placed.  Appropriate screening laboratory values were ordered for the patient including screening of hyperlipidemia, renal function and hepatic function.   Medication reconciliation,  past medical history, social history, problem list and allergies were reviewed in detail with the patient  Goals were established with regard to weight loss, exercise, and  diet in compliance with medications Wt Readings from Last 3 Encounters:  06/08/22 185 lb (83.9 kg)  08/11/21 185 lb (83.9 kg)  07/05/21 185 lb (83.9 kg)   She is up to date on routine colon cancer screening   Review of Systems  Constitutional: Negative.   HENT: Negative.    Eyes: Negative.   Respiratory: Negative.    Cardiovascular: Negative.   Gastrointestinal:  Negative.   Endocrine: Negative.   Genitourinary: Negative.   Musculoskeletal: Negative.   Skin: Negative.   Allergic/Immunologic: Negative.   Neurological: Negative.   Hematological: Negative.   Psychiatric/Behavioral: Negative.     Past Medical History:  Diagnosis Date   Anemia    no longer an issue since hysterectomy   Hyperlipidemia    diet controlled   OSA (obstructive sleep apnea)    no c-pap used    Social History   Socioeconomic History   Marital status: Married    Spouse name: Not on file   Number of children: 2   Years of education: Not on file   Highest education level: Not on file  Occupational History   Not on file  Tobacco Use   Smoking status: Former   Smokeless tobacco: Never  Vaping Use   Vaping Use: Never used  Substance and Sexual Activity   Alcohol use: No    Alcohol/week: 0.0 standard drinks of alcohol   Drug use: No   Sexual activity: Yes  Other Topics Concern   Not on file  Social History Narrative   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   Married for 11 years    2 children both live in [place]   She enjoys painting, reading, poetry, and likes outdoors   Social Determinants of Health   Financial Resource Strain: Unknown (07/01/2021)   Overall Financial Resource Strain (CARDIA)    Difficulty of Paying Living Expenses: Patient refused  Food Insecurity: Unknown (07/01/2021)  Hunger Vital Sign    Worried About Running Out of Food in the Last Year: Patient refused    [place] in the Last Year: Patient refused  Transportation Needs: Unknown (07/01/2021)   [place] - Transportation    Lack of Transportation (Medical): Patient refused    Lack of Transportation (Non-Medical): Patient refused  Physical Activity: Sufficiently Active (07/01/2021)   Exercise Vital Sign    Days of Exercise per Week: 5 days    Minutes of Exercise per Session: 40 min  Stress: No Stress Concern Present (07/01/2021)   [place] of Stress : Not at all  Social Connections: Unknown (07/01/2021)   Social Connection and Isolation Panel [NHANES]    Frequency of Communication with Friends and Family: Patient refused    Frequency of Social Gatherings with Friends and Family: Patient refused    Attends Religious Services: Patient refused    Marine scientist or Organizations: Patient refused    Attends Music therapist: Not on file    Marital Status: Married  Human resources officer Violence: Not on file    Past Surgical History:  Procedure Laterality Date   ABDOMINAL HYSTERECTOMY     still has ovaries   CESAREAN SECTION  [phone], 1993,   x 3   COLONOSCOPY     MYOMECTOMY      Family History  Problem Relation Age of Onset   Stroke Brother    Stroke Other    Hypertension Mother    Diabetes Mother        ?   Hypertension Father    Prostate cancer Father    Hypertension Brother     Allergies  Allergen Reactions   Fenofibrate     Elevated Lft   Statins     Elevated LFTS    No current outpatient medications on file prior to visit.   No current facility-administered medications on file prior to visit.    BP 128/80   Pulse 85   Temp 98.5 F (36.9 C) (Oral)   Ht 5' 3.5" (1.613 m)   Wt 185 lb (83.9 kg)   SpO2 98%   BMI 32.26 kg/m       Objective:   Physical Exam Vitals and nursing note reviewed.  Constitutional:      General: She is not in acute distress.    Appearance: Normal appearance. She is well-developed. She is not ill-appearing.  HENT:     Head: Normocephalic and atraumatic.     Right Ear: Tympanic membrane, ear canal and external ear normal. There is no impacted cerumen.     Left Ear: Tympanic membrane, ear canal and external ear normal. There is no impacted cerumen.     Nose: Nose normal. No congestion or rhinorrhea.     Mouth/Throat:     Mouth: Mucous membranes are moist.     Pharynx: Oropharynx is clear. No  oropharyngeal exudate or posterior oropharyngeal erythema.  Eyes:     General:        Right eye: No discharge.        Left eye: No discharge.     Extraocular Movements: Extraocular movements intact.     Conjunctiva/sclera: Conjunctivae normal.     Pupils: Pupils are equal, round, and reactive to light.  Neck:     Thyroid: No thyromegaly.     Vascular: No carotid bruit.     Trachea: No tracheal deviation.  Cardiovascular:     Rate and Rhythm: Normal rate and regular rhythm.     Pulses: Normal pulses.     Heart sounds: Normal heart sounds. No murmur heard.    No friction rub. No gallop.  Pulmonary:     Effort: Pulmonary effort is normal. No respiratory distress.     Breath sounds: Normal breath sounds. No stridor. No wheezing, rhonchi or rales.  Chest:     Chest wall: No tenderness.  Abdominal:     General: Abdomen is flat. Bowel sounds are normal. There is no distension.     Palpations: Abdomen is soft. There is no mass.     Tenderness: There is no abdominal tenderness. There is no right CVA tenderness, left CVA tenderness, guarding or rebound.     Hernia: No hernia is present.  Musculoskeletal:        General: No swelling, tenderness, deformity or signs of injury. Normal range of motion.     Cervical back: Normal range of motion and neck supple.     Right lower leg: No edema.     Left lower leg: No edema.  Lymphadenopathy:     Cervical: No cervical adenopathy.  Skin:    General: Skin is warm and dry.     Coloration: Skin is not jaundiced or pale.     Findings: No bruising, erythema, lesion or rash.  Neurological:     General: No focal deficit present.     Mental Status: She is alert and oriented to person, place, and time.     Cranial Nerves: No cranial nerve deficit.     Sensory: No sensory deficit.     Motor: No weakness.     Coordination: Coordination normal.     Gait: Gait normal.     Deep Tendon Reflexes: Reflexes normal.  Psychiatric:        Mood and Affect: Mood  normal.        Behavior: Behavior normal.        Thought Content: Thought content normal.        Judgment: Judgment normal.       Assessment & Plan:  1. Routine general medical examination at a health care facility Today patient counseled on age appropriate routine health concerns for screening and prevention, each reviewed and up to date or declined. Immunizations reviewed and up to date or declined. Labs ordered and reviewed. Risk factors for depression reviewed and negative. Hearing function and visual acuity are intact. ADLs screened and addressed as needed. Functional ability and level of safety reviewed and appropriate. Education, counseling and referrals performed based on assessed risks today. Patient provided with a copy of personalized plan for preventive services.   2. Pre-diabetes - Consider adding metformin. Continue with lifestyle modifications  - CBC with Differential/Platelet; Future - Comprehensive metabolic panel; Future - Lipid panel; Future - TSH; Future - Hemoglobin A1c; Future - Hemoglobin A1c - TSH - Lipid panel - Comprehensive metabolic panel - CBC with Differential/Platelet  3. Familial hypercholesterolemia - Follow up with lipid clinic  - CBC with Differential/Platelet; Future - Comprehensive metabolic panel; Future - Lipid panel; Future - TSH; Future - TSH - Lipid panel - Comprehensive metabolic panel - CBC with Differential/Platelet  Dorothyann Peng, NP

## 2022-12-08 DIAGNOSIS — Z1231 Encounter for screening mammogram for malignant neoplasm of breast: Secondary | ICD-10-CM | POA: Diagnosis not present

## 2022-12-08 LAB — HM MAMMOGRAPHY

## 2022-12-12 ENCOUNTER — Encounter: Payer: Self-pay | Admitting: Adult Health

## 2022-12-12 DIAGNOSIS — H5202 Hypermetropia, left eye: Secondary | ICD-10-CM | POA: Diagnosis not present

## 2023-07-26 ENCOUNTER — Ambulatory Visit (INDEPENDENT_AMBULATORY_CARE_PROVIDER_SITE_OTHER): Admitting: Adult Health

## 2023-07-26 ENCOUNTER — Encounter: Payer: Self-pay | Admitting: Adult Health

## 2023-07-26 VITALS — BP 120/88 | HR 83 | Temp 97.6°F | Ht 63.5 in | Wt 188.0 lb

## 2023-07-26 DIAGNOSIS — R7303 Prediabetes: Secondary | ICD-10-CM | POA: Diagnosis not present

## 2023-07-26 DIAGNOSIS — E7801 Familial hypercholesterolemia: Secondary | ICD-10-CM

## 2023-07-26 DIAGNOSIS — Z780 Asymptomatic menopausal state: Secondary | ICD-10-CM

## 2023-07-26 DIAGNOSIS — Z0000 Encounter for general adult medical examination without abnormal findings: Secondary | ICD-10-CM | POA: Diagnosis not present

## 2023-07-26 LAB — COMPREHENSIVE METABOLIC PANEL WITH GFR
ALT: 21 U/L (ref 0–35)
AST: 30 U/L (ref 0–37)
Albumin: 4.6 g/dL (ref 3.5–5.2)
Alkaline Phosphatase: 80 U/L (ref 39–117)
BUN: 12 mg/dL (ref 6–23)
CO2: 30 meq/L (ref 19–32)
Calcium: 9.7 mg/dL (ref 8.4–10.5)
Chloride: 102 meq/L (ref 96–112)
Creatinine, Ser: 0.83 mg/dL (ref 0.40–1.20)
GFR: 73.63 mL/min (ref >60.00)
Glucose, Bld: 92 mg/dL (ref 70–99)
Potassium: 4.5 meq/L (ref 3.5–5.1)
Sodium: 138 meq/L (ref 135–145)
Total Bilirubin: 0.4 mg/dL (ref 0.2–1.2)
Total Protein: 7.3 g/dL (ref 6.0–8.3)

## 2023-07-26 LAB — CBC WITH DIFFERENTIAL/PLATELET
Basophils Absolute: 0 10*3/uL (ref 0.0–0.1)
Basophils Relative: 0.5 % (ref 0.0–3.0)
Eosinophils Absolute: 0.1 10*3/uL (ref 0.0–0.7)
Eosinophils Relative: 2 % (ref 0.0–5.0)
HCT: 42.3 % (ref 36.0–46.0)
Hemoglobin: 14.1 g/dL (ref 12.0–15.0)
Lymphocytes Relative: 43.4 % (ref 12.0–46.0)
Lymphs Abs: 1.4 10*3/uL (ref 0.7–4.0)
MCHC: 33.3 g/dL (ref 30.0–36.0)
MCV: 89 fl (ref 78.0–100.0)
Monocytes Absolute: 0.2 10*3/uL (ref 0.1–1.0)
Monocytes Relative: 5.8 % (ref 3.0–12.0)
Neutro Abs: 1.5 10*3/uL (ref 1.4–7.7)
Neutrophils Relative %: 48.3 % (ref 43.0–77.0)
Platelets: 253 10*3/uL (ref 150.0–400.0)
RBC: 4.75 Mil/uL (ref 3.87–5.11)
RDW: 13.8 % (ref 11.5–15.5)
WBC: 3.2 10*3/uL — ABNORMAL LOW (ref 4.0–10.5)

## 2023-07-26 LAB — LIPID PANEL
Cholesterol: 276 mg/dL — ABNORMAL HIGH (ref 0–200)
HDL: 85.5 mg/dL (ref >39.00)
LDL Cholesterol: 172 mg/dL — ABNORMAL HIGH (ref 0–99)
NonHDL: 190.89
Total CHOL/HDL Ratio: 3
Triglycerides: 94 mg/dL (ref 0.0–149.0)
VLDL: 18.8 mg/dL (ref 0.0–40.0)

## 2023-07-26 LAB — TSH: TSH: 1.33 u[IU]/mL (ref 0.35–5.50)

## 2023-07-26 LAB — HEMOGLOBIN A1C: Hgb A1c MFr Bld: 6.2 % (ref 4.6–6.5)

## 2023-07-26 LAB — VITAMIN D 25 HYDROXY (VIT D DEFICIENCY, FRACTURES): VITD: 23.44 ng/mL — ABNORMAL LOW (ref 30.00–100.00)

## 2023-07-26 NOTE — Patient Instructions (Addendum)
 It was great seeing you today   We will follow up with you regarding your lab work   Please let me know if you need anything    TENS UNIT and Accupressure mat   Schedule your bone density test at check out desk. You may also call directly to X-ray at [phone] to schedule an appointment that is convenient for you.  - located [address] across the street from [place] - in the basement - you do need an appointment for the bone density tests.

## 2023-07-26 NOTE — Progress Notes (Addendum)
 Subjective:    Patient ID: Shelia Smith, female    DOB: February 28, 1956, 68 y.o.   MRN: [id number]  HPI Patient presents for yearly preventative medicine examination. She is a pleasant 68 year old female who  has a past medical history of Anemia, Hyperlipidemia, and OSA (obstructive sleep apnea).  She retired at the end of November - she is enjoying retirement.   Hyperlipidemia  In the past she has been trialed on fenofibrate and statins but was unable to take these due to elevation in her liver enzymes.She has had genetic testing done and found to have a genetic component to her elevated cholesterol levels. She also had a CT Calcium Score done in 2021 with a score of 0 . She is interested in starting Repatha and would like a referral back to the lipid clinic Lab Results  Component Value Date   CHOL 289 (H) 06/08/2022   HDL 80.30 06/08/2022   LDLCALC 191 (H) 06/08/2022   LDLDIRECT 189.9 04/03/2012   TRIG 86.0 06/08/2022   CHOLHDL 4 06/08/2022   Pre-Diabetes - not currently on medications. She has been working on lifestyle modifications. She continues to watch her carbs and sugars. She is still exercising on a routine basis  Lab Results  Component Value Date   HGBA1C 6.1 06/08/2022   HGBA1C 6.0 (A) 07/06/2021   HGBA1C 6.3 04/05/2021   All immunizations and health maintenance protocols were reviewed with the patient and needed orders were placed.  Appropriate screening laboratory values were ordered for the patient including screening of hyperlipidemia, renal function and hepatic function.  Medication reconciliation,  past medical history, social history, problem list and allergies were reviewed in detail with the patient  Goals were established with regard to weight loss, exercise, and  diet in compliance with medications. She does stay active and enjoys walking. She eats healthy.   Wt Readings from Last 3 Encounters:  07/26/23 188 lb (85.3 kg)  06/08/22 185 lb (83.9 kg)  08/11/21  185 lb (83.9 kg)   She is up to date on routine colon cancer screening and mammograms. She is due for Bone Density Screening   Review of Systems  Constitutional: Negative.   HENT: Negative.    Eyes: Negative.   Respiratory: Negative.    Cardiovascular: Negative.   Gastrointestinal: Negative.   Endocrine: Negative.   Genitourinary: Negative.   Musculoskeletal: Negative.   Skin: Negative.   Allergic/Immunologic: Negative.   Neurological: Negative.   Hematological: Negative.   Psychiatric/Behavioral: Negative.     Past Medical History:  Diagnosis Date   Anemia    no longer an issue since hysterectomy   Hyperlipidemia    diet controlled   OSA (obstructive sleep apnea)    no c-pap used    Social History   Socioeconomic History   Marital status: Married    Spouse name: Not on file   Number of children: 2   Years of education: Not on file   Highest education level: Bachelor's degree (e.g., BA, AB, BS)  Occupational History   Not on file  Tobacco Use   Smoking status: Former   Smokeless tobacco: Never  Vaping Use   Vaping status: Never Used  Substance and Sexual Activity   Alcohol use: No    Alcohol/week: 0.0 standard drinks of alcohol   Drug use: No   Sexual activity: Yes  Other Topics Concern   Not on file  Social History Narrative   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  Married for 11 years    2 children both live in    She enjoys painting, reading, poetry, and likes outdoors   Social Drivers of Corporate investment banker Strain: Low Risk  (07/22/2023)   Overall Financial Resource Strain (CARDIA)    Difficulty of Paying Living Expenses: Not very hard  Food Insecurity: No Food Insecurity (07/22/2023)   Hunger Vital Sign    Worried About Running Out of Food in the Last Year: Never true    Ran Out of Food in the Last Year: Never true  Transportation Needs: No Transportation Needs (07/22/2023)   PRAPARE - Administrator, Civil Service  (Medical): No    Lack of Transportation (Non-Medical): No  Physical Activity: Sufficiently Active (07/22/2023)   Exercise Vital Sign    Days of Exercise per Week: 5 days    Minutes of Exercise per Session: 30 min  Stress: No Stress Concern Present (07/22/2023)   [employer] of Occupational Health - Occupational Stress Questionnaire    Feeling of Stress : Not at all  Social Connections: Socially Integrated (07/22/2023)   Social Connection and Isolation Panel [NHANES]    Frequency of Communication with Friends and Family: More than three times a week    Frequency of Social Gatherings with Friends and Family: Three times a week    Attends Religious Services: More than 4 times per year    Active Member of Clubs or Organizations: Yes    Attends Banker Meetings: More than 4 times per year    Marital Status: Married  Catering manager Violence: Unknown (07/29/2021)   Received from [facility], [facility]   HITS    Physically Hurt: Not on file    Insult or Talk Down To: Not on file    Threaten Physical Harm: Not on file    Scream or Curse: Not on file    Past Surgical History:  Procedure Laterality Date   ABDOMINAL HYSTERECTOMY     still has ovaries   CESAREAN SECTION  1610,9604, 1993,   x 3   COLONOSCOPY     MYOMECTOMY      Family History  Problem Relation Age of Onset   Stroke Brother    Stroke Other    Hypertension Mother    Diabetes Mother        ?   Hypertension Father    Prostate cancer Father    Hypertension Brother     Allergies  Allergen Reactions   Fenofibrate     Elevated Lft   Statins     Elevated LFTS    No current outpatient medications on file prior to visit.   No current facility-administered medications on file prior to visit.    BP 120/88   Pulse 83   Temp 97.6 F (36.4 C) (Oral)   Ht 5' 3.5" (1.613 m)   Wt 188 lb (85.3 kg)   SpO2 98%   BMI 32.78 kg/m       Objective:   Physical Exam Vitals and nursing note  reviewed.  Constitutional:      General: She is not in acute distress.    Appearance: Normal appearance. She is not ill-appearing.  HENT:     Head: Normocephalic and atraumatic.     Right Ear: Tympanic membrane, ear canal and external ear normal. There is no impacted cerumen.     Left Ear: Tympanic membrane, ear canal and external ear normal. There is no impacted cerumen.  Nose: Nose normal. No congestion or rhinorrhea.     Mouth/Throat:     Mouth: Mucous membranes are moist.     Pharynx: Oropharynx is clear.  Eyes:     Extraocular Movements: Extraocular movements intact.     Conjunctiva/sclera: Conjunctivae normal.     Pupils: Pupils are equal, round, and reactive to light.  Neck:     Vascular: No carotid bruit.  Cardiovascular:     Rate and Rhythm: Normal rate and regular rhythm.     Pulses: Normal pulses.     Heart sounds: No murmur heard.    No friction rub. No gallop.  Pulmonary:     Effort: Pulmonary effort is normal.     Breath sounds: Normal breath sounds.  Abdominal:     General: Abdomen is flat. Bowel sounds are normal. There is no distension.     Palpations: Abdomen is soft. There is no mass.     Tenderness: There is no abdominal tenderness. There is no guarding or rebound.     Hernia: No hernia is present.  Musculoskeletal:        General: Normal range of motion.     Cervical back: Normal range of motion and neck supple.  Lymphadenopathy:     Cervical: No cervical adenopathy.  Skin:    General: Skin is warm and dry.     Capillary Refill: Capillary refill takes less than 2 seconds.  Neurological:     General: No focal deficit present.     Mental Status: She is alert and oriented to person, place, and time.  Psychiatric:        Mood and Affect: Mood normal.        Behavior: Behavior normal.        Thought Content: Thought content normal.        Judgment: Judgment normal.        Assessment & Plan:  1. Routine general medical examination at a health care  facility (Primary) Today patient counseled on age appropriate routine health concerns for screening and prevention, each reviewed and up to date or declined. Immunizations reviewed and up to date or declined. Labs ordered and reviewed. Risk factors for depression reviewed and negative. Hearing function and visual acuity are intact. ADLs screened and addressed as needed. Functional ability and level of safety reviewed and appropriate. Education, counseling and referrals performed based on assessed risks today. Patient provided with a copy of personalized plan for preventive services. - Refused PNA vaccination today  - Continue to stay active and eat healthy  - Follow up in one year or sooner if needed  2. Pre-diabetes - Consider Metformin  - CBC with Differential/Platelet; Future - Comprehensive metabolic panel with GFR; Future - Lipid panel; Future - TSH; Future - Hemoglobin A1c; Future  3. Familial hypercholesterolemia  - CBC with Differential/Platelet; Future - Comprehensive metabolic panel with GFR; Future - Lipid panel; Future - TSH; Future - AMB Referral to Advanced Lipid Disorders Clinic  4. Asymptomatic postmenopausal estrogen deficiency  - CBC with Differential/Platelet; Future - Comprehensive metabolic panel with GFR; Future - Lipid panel; Future - TSH; Future - VITAMIN D 25 Hydroxy (Vit-D Deficiency, Fractures); Future - DG Bone Density; Future  Shirline Frees, NP

## 2023-08-01 ENCOUNTER — Encounter: Payer: Self-pay | Admitting: Adult Health

## 2023-08-02 ENCOUNTER — Ambulatory Visit (INDEPENDENT_AMBULATORY_CARE_PROVIDER_SITE_OTHER)
Admission: RE | Admit: 2023-08-02 | Discharge: 2023-08-02 | Disposition: A | Discharge status: Home / Self Care | Source: Ambulatory Visit | Attending: Adult Health | Admitting: Adult Health

## 2023-08-02 DIAGNOSIS — Z780 Asymptomatic menopausal state: Secondary | ICD-10-CM

## 2023-08-02 NOTE — Telephone Encounter (Signed)
**Note De-identified  [place]bright Obfuscation** Please advise 

## 2023-08-03 ENCOUNTER — Encounter: Payer: Self-pay | Admitting: Adult Health

## 2023-08-03 NOTE — Telephone Encounter (Signed)
 FYI

## 2023-08-21 ENCOUNTER — Other Ambulatory Visit (HOSPITAL_COMMUNITY): Payer: Self-pay

## 2023-08-21 ENCOUNTER — Telehealth: Payer: Self-pay

## 2023-08-21 ENCOUNTER — Encounter: Payer: Self-pay | Admitting: Internal Medicine

## 2023-08-21 ENCOUNTER — Ambulatory Visit: Attending: Internal Medicine | Admitting: Internal Medicine

## 2023-08-21 ENCOUNTER — Other Ambulatory Visit (HOSPITAL_BASED_OUTPATIENT_CLINIC_OR_DEPARTMENT_OTHER): Payer: Self-pay

## 2023-08-21 VITALS — BP 120/78 | HR 84 | Ht 65.0 in | Wt 186.8 lb

## 2023-08-21 DIAGNOSIS — E7801 Familial hypercholesterolemia: Secondary | ICD-10-CM | POA: Diagnosis not present

## 2023-08-21 MED ORDER — REPATHA SURECLICK 140 MG/ML ~~LOC~~ SOAJ
140.0000 mg | SUBCUTANEOUS | 11 refills | Status: AC
  Filled 2023-08-21: qty 2, 28d supply, fill #0
  Filled 2023-09-07 – 2023-09-11 (×2): qty 2, 28d supply, fill #1
  Filled 2023-10-08 – 2023-10-10 (×2): qty 2, 28d supply, fill #2
  Filled 2023-11-08: qty 2, 28d supply, fill #3
  Filled 2023-12-04: qty 2, 28d supply, fill #4
  Filled 2024-01-02: qty 2, 28d supply, fill #5
  Filled 2024-01-31: qty 2, 28d supply, fill #6
  Filled 2024-02-25: qty 2, 28d supply, fill #7
  Filled 2024-03-27: qty 2, 28d supply, fill #8
  Filled 2024-04-21: qty 2, 28d supply, fill #9
  Filled 2024-05-20: qty 2, 28d supply, fill #10

## 2023-08-21 NOTE — Patient Instructions (Signed)
 Medication Instructions:  Dr. Maximo Spar recommends Repatha (PCSK9). This is an injectable cholesterol medication self-administered once every 14 days. This medication will likely need prior approval with your insurance company, which we will work on. If the medication is not approved initially, we may need to do an appeal with your insurance.   Administer medication in area of fatty tissue such as abdomen, outer thigh, back of upper arm - and rotate site with each injection Store medication in refrigerator until ready to administer - allow to sit at room temp for 30 mins - 1 hour prior to injection Dispose of medication in a SHARPS container - your pharmacy should be able to direct you on this and proper disposal   If you need a co-pay card for Repatha: Lawsponsor.fr If you need a co-pay card for Praluent: [email or web address]  Patient Assistance:    These foundations have funds at various times.   The PAN Foundation: https://www.panfoundation.org/disease-funds/hypercholesterolemia/ -- can sign up for wait list  The [facility] offers assistance to help pay for medication copays.  They will cover copays for all cholesterol lowering meds, including statins, fibrates, omega-3 fish oils like Vascepa, ezetimibe, Repatha, Praluent, Nexletol, Nexlizet.  The cards are usually good for $2,500 or 12 months, whichever comes first. Our fax # is [phone] (you will need this to apply) Go to healthwellfoundation.org Click on "Apply Now" Answer questions as to whom is applying (patient or representative) Your disease fund will be "hypercholesterolemia - Medicare access" They will ask questions about finances and which medications you are taking for cholesterol When you submit, the approval is usually within minutes.  You will need to print the card information from the site You will need to show this information to your pharmacy, they will bill your  Medicare Part D plan first -then bill Health Well --for the copay.   You can also call them at [phone], although the hold times can be quite long.   *If you need a refill on your cardiac medications before your next appointment, please call your pharmacy*  Lab Work: NMR, Lpa in 3-4 months Hepatic Profile in 1 month  If you have labs (blood work) drawn today and your tests are completely normal, you will receive your results only by: MyChart Message (if you have MyChart) OR A paper copy in the mail If you have any lab test that is abnormal or we need to change your treatment, we will call you to review the results.  Follow-Up: At [facility], you and your health needs are our priority.  As part of our continuing mission to provide you with exceptional heart care, our providers are all part of one team.  This team includes your primary Cardiologist (physician) and Advanced Practice Providers or APPs (Physician Assistants and Nurse Practitioners) who all work together to provide you with the care you need, when you need it.  Your next appointment:   3-4 month(s)  Provider:   Slater Duncan NP

## 2023-08-21 NOTE — Progress Notes (Signed)
 LIPID CLINIC CONSULT NOTE  Chief Complaint:  Follow-up dyslipidemia  Primary Care Physician: Alto Atta, NP  Primary Cardiologist:  None  HPI:  Shelia Smith is a 68 y.o. female who is being seen today for the evaluation of dyslipidemia at the request of Alto Atta, NP.  This is a pleasant 68 year old female who is very physically active.  She exercises regularly.  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  She has never had any heart problems.  She reports that she has had high cholesterol for most of her adult life.  She has been tried on statins before as well as fenofibrate , both raised her liver enzymes.  She said remotely she may have had an ultrasound of her liver when she was seeing Dr. Larrie Po, prior to transitioning over to her current provider.  There is is history of stroke in her brother as well as high blood pressure in her mother and she also had elevated cholesterol.  She has 4 brothers, but she not aware that any of them have high cholesterol.  She denies any chest pain or shortness of breath or any exercise intolerance.  Her lipids are markedly abnormal, total cholesterol 301, triglycerides 118, HDL 81 and LDL 196.  These findings are concerning for familial hyperlipidemia however I wonder if this is a pathogenic familial hyperlipidemia or perhaps mutation in ApoB which in some instances could be nonpathogenic.  08/11/2021  Shelia Smith is seen today for follow-up.  I last saw her about 2 years ago.  Which time I had ordered a coronary calcium score.  This came back as 0 which is reassuring.  She subsequently tried to work on diet and had some weight loss actually about 10 pounds.  Despite this, her cholesterol remains elevated, suggesting a genetic cholesterol disorder.  Total cholesterol 294, triglycerides 108, HDL 84 and LDL 189.  Although she has a high HDL cholesterol, the question is whether she is cardioprotective or not.  We discussed possible management  options.  She would like to consider genetic testing which I think is very reasonable.  08/21/2023  Shelia Smith is seen today in follow-up.  She had recent lipids which show persistently elevated LDL cholesterol.  Total 276, triglycerides 94, HDL 85 and LDL 172.  Liver enzymes are normal with AST and ALT of 30 and 21 respectively.  She had not decided to go on to Repatha after we did recent genetic testing.  This is despite abnormal genetic testing which did show 7 variants of unknown significance including APO E (E3/E4), APO A5, L IPC, APO B and PCSK9, as well as NPC 1L1.  2 of these variants were considered cardioprotective however several are associated with cardiovascular disease and APO E is considered a strong risk factor for cardiovascular disease.  There is an association with stroke in her family among multiple members.  PMHx:  Past Medical History:  Diagnosis Date   Anemia    no longer an issue since hysterectomy   Hyperlipidemia    diet controlled   OSA (obstructive sleep apnea)    no c-pap used    Past Surgical History:  Procedure Laterality Date   ABDOMINAL HYSTERECTOMY     still has ovaries   CESAREAN SECTION  1610,9604, 1993,   x 3   COLONOSCOPY     MYOMECTOMY      FAMHx:  Family History  Problem Relation Age of Onset   Stroke Brother    Stroke Other  Hypertension Mother    Diabetes Mother        ?   Hypertension Father    Prostate cancer Father    Hypertension Brother     SOCHx:   reports that she has quit smoking. She has never used smokeless tobacco. She reports that she does not drink alcohol and does not use drugs.  ALLERGIES:  Allergies  Allergen Reactions   Fenofibrate      Elevated Lft   Statins     Elevated LFTS    ROS: Pertinent items noted in HPI and remainder of comprehensive ROS otherwise negative.  HOME MEDS: Current Outpatient Medications on File Prior to Visit  Medication Sig Dispense Refill   Calcium Citrate 150 MG CAPS daily.      cholecalciferol (VITAMIN D3) 25 MCG (1000 UNIT) tablet Take by mouth daily.     No current facility-administered medications on file prior to visit.    LABS/IMAGING: No results found for this or any previous visit (from the past 48 hours). No results found.  LIPID PANEL:    Component Value Date/Time   CHOL 276 (H) 07/26/2023 1157   TRIG 94.0 07/26/2023 1157   HDL 85.50 07/26/2023 1157   CHOLHDL 3 07/26/2023 1157   VLDL 18.8 07/26/2023 1157   LDLCALC 172 (H) 07/26/2023 1157   LDLCALC 188 (H) 03/31/2020 0850   LDLDIRECT 189.9 04/03/2012 0835    WEIGHTS: Wt Readings from Last 3 Encounters:  08/21/23 186 lb 12.8 oz (84.7 kg)  07/26/23 188 lb (85.3 kg)  06/08/22 185 lb (83.9 kg)    VITALS: BP 120/78 (BP Location: Left Arm, Patient Position: Sitting, Cuff Size: Normal)   Pulse 84   Ht 5\' 5"  (1.651 m)   Wt 186 lb 12.8 oz (84.7 kg)   SpO2 99%   BMI 31.09 kg/m   EXAM: Deferred  EKG: Deferred  ASSESSMENT: Probable familial hyperlipidemia -genetic testing showed 7 variants of unknown significance including APO E4, APO A5 and variants in APO B and PCSK9 as well as an PC1L1 Statin intolerance-elevated LFTs Fenofibrate  intolerance-elevated LFTs Zero coronary calcium (05/2019)  PLAN: 1.   Shelia Smith had multiple genetic variants including 2 which showed increased risk of cardiovascular disease and to which may be cardioprotective as well as others that are associated with cardiovascular disease.  She had no calcium in 2021 but I believe would be at increased risk based on this genetic data of developing cardiovascular disease in the future.  There is stroke in multiple family members.  I think we can reduce that risk with lowering her cholesterol.  I would advise Repatha as an alternative.  Since she had elevated liver enzymes in the past this is much less likely to cause those side effects although we will monitor her liver enzymes closely.  After long discussion about the  medicine today including potential risks including a very small risk of increase in blood sugar and very low likelihood of side effects including myalgias or flulike symptoms, she is agreeable to proceed.  Will reach out for prior authorization.  Plan repeat lipids including NMR and LP(a) in about 3 to 4 months.  She can likely follow-up with our lipid APP at that time.  Hazle Lites, MD, [facility] FNLA, FACP  [facility]  [facility]  Medical Director of the Advanced Lipid Disorders &  Cardiovascular Risk Reduction Clinic Diplomate of the American Board of Clinical Lipidology Attending Cardiologist  Direct Dial: [phone]  Fax: [phone]  Website:  [email or web address]  Aviva Lemmings Savera Donson 08/21/2023, 11:25 AM

## 2023-08-21 NOTE — Telephone Encounter (Signed)
 Pharmacy Patient Advocate Encounter   Received notification from Physician's Office that prior authorization for REPATHA is required/requested.   Insurance verification completed.   The patient is insured through  [facility]  .   Per test claim: PA required; PA submitted to above mentioned insurance via CoverMyMeds Key/confirmation #/EOC [id number] Status is pending

## 2023-08-21 NOTE — Telephone Encounter (Signed)
 Update on med approval sent to patient via MyChart Reply pending re: preferred pharmacy

## 2023-08-21 NOTE — Telephone Encounter (Signed)
-----   Message from Nurse Jocabed C sent at 08/21/2023 12:23 PM EDT ----- Good afternoon. This patient was prescribed Repatha today. They will need a PA please.  Thank you, Josie RN

## 2023-08-21 NOTE — Telephone Encounter (Signed)
 Rx(s) sent to pharmacy electronically.

## 2023-08-21 NOTE — Telephone Encounter (Signed)
 Pharmacy Patient Advocate Encounter  Received notification from  [facility]  that Prior Authorization for REPATHA has been APPROVED from 08/21/23 to 08/20/24. Ran test claim, Copay is $35. This test claim was processed through [facility]- copay amounts may vary at other pharmacies due to pharmacy/plan contracts, or as the patient moves through the different stages of their insurance plan.

## 2023-08-21 NOTE — Addendum Note (Signed)
 Addended by: Francesco Inks on: 08/21/2023 04:23 PM   Modules accepted: Orders

## 2023-09-07 ENCOUNTER — Other Ambulatory Visit (HOSPITAL_BASED_OUTPATIENT_CLINIC_OR_DEPARTMENT_OTHER): Payer: Self-pay

## 2023-09-18 ENCOUNTER — Other Ambulatory Visit (HOSPITAL_BASED_OUTPATIENT_CLINIC_OR_DEPARTMENT_OTHER): Payer: Self-pay

## 2023-09-19 ENCOUNTER — Ambulatory Visit: Payer: Self-pay

## 2023-09-19 LAB — HEPATIC FUNCTION PANEL
ALT: 30 IU/L (ref 0–32)
AST: 26 IU/L (ref 0–40)
Albumin: 4.5 g/dL (ref 3.9–4.9)
Alkaline Phosphatase: 105 IU/L (ref 44–121)
Bilirubin Total: 0.3 mg/dL (ref 0.0–1.2)
Bilirubin, Direct: 0.11 mg/dL (ref 0.00–0.40)
Total Protein: 6.7 g/dL (ref 6.0–8.5)

## 2023-09-25 ENCOUNTER — Other Ambulatory Visit: Payer: Self-pay | Admitting: Orthopedic Surgery

## 2023-09-25 DIAGNOSIS — M259 Joint disorder, unspecified: Secondary | ICD-10-CM

## 2023-10-08 ENCOUNTER — Other Ambulatory Visit (HOSPITAL_BASED_OUTPATIENT_CLINIC_OR_DEPARTMENT_OTHER): Payer: Self-pay

## 2023-10-10 ENCOUNTER — Other Ambulatory Visit (HOSPITAL_BASED_OUTPATIENT_CLINIC_OR_DEPARTMENT_OTHER): Payer: Self-pay

## 2023-10-10 ENCOUNTER — Ambulatory Visit
Admission: RE | Admit: 2023-10-10 | Discharge: 2023-10-10 | Disposition: A | Discharge status: Home / Self Care | Source: Ambulatory Visit | Attending: Orthopedic Surgery | Admitting: Orthopedic Surgery

## 2023-10-10 DIAGNOSIS — M259 Joint disorder, unspecified: Secondary | ICD-10-CM

## 2023-10-10 MED ORDER — IOPAMIDOL (ISOVUE-M 200) INJECTION 41%
1.0000 mL | Freq: Once | INTRAMUSCULAR | Status: AC | PRN
  Administered 2023-10-10: 1 mL via INTRATHECAL

## 2023-10-16 ENCOUNTER — Other Ambulatory Visit (HOSPITAL_BASED_OUTPATIENT_CLINIC_OR_DEPARTMENT_OTHER): Payer: Self-pay

## 2023-10-16 MED ORDER — SULFAMETHOXAZOLE-TRIMETHOPRIM 800-160 MG PO TABS
1.0000 | ORAL_TABLET | Freq: Two times a day (BID) | ORAL | 0 refills | Status: DC
  Filled 2023-10-16: qty 10, 5d supply, fill #0

## 2023-11-25 LAB — NMR, LIPOPROFILE
Cholesterol, Total: 221 mg/dL — AB (ref 100–199)
HDL Particle Number: 52.7 umol/L (ref >=30.5)
HDL-C: 93 mg/dL (ref >39)
LDL Particle Number: 1256 nmol/L — AB (ref <1000)
LDL Size: 21.3 nm (ref >20.5)
LDL-C (NIH Calc): 109 mg/dL — AB (ref 0–99)
LP-IR Score: 25 (ref <=45)
Small LDL Particle Number: 458 nmol/L (ref <=527)
Triglycerides: 113 mg/dL (ref 0–149)

## 2023-11-25 LAB — LIPOPROTEIN A (LPA): Lipoprotein (a): 126.8 nmol/L — ABNORMAL HIGH (ref <75.0)

## 2023-11-25 NOTE — Progress Notes (Signed)
 " Cardiology Office Note   Date:  11/26/2023  ID:  Shelia Smith, DOB Mar 23, 1956, MRN [id number] PCP: Merna Huxley, NP  [facility] Providers Cardiologist:  None     PMH Dyslipidemia Familial hyperlipidemia Fatty liver disease Elevated lipoprotein a  Referred to advanced lipid disorder clinic and seen by Dr. Mona 06/02/2019.  History of elevated cholesterol most of her adult life.  She has been on statins as well as fenofibrate , both of which raised her liver enzymes.  History of stroke in her brother as well as hypertension and hyperlipidemia in her mother.  Not aware that any of her brothers have high cholesterol.  Markedly elevated lipids at that time with total cholesterol 301, triglycerides 118, HDL 81, and LDL 196 with findings concerning for familial hyperlipidemia.  She was living an active lifestyle and eating healthy diet at that time.  CT calcium score performed 06/19/2019 with CAC score of 0.  Follow-up visit 08/11/21 with Dr. Mona. She had been working on diet and had subsequently lost 10 pounds.  Despite weight loss, her cholesterol remained elevated suggesting a genetic cholesterol disorder.  Total cholesterol 294, triglycerides 108, HDL 84 and LDL 189.  Genetic testing revealed no classic pathogenic variants, however variants of unknown significance were high including APO E (E3/E4), APO A5, L IPC, APO B, and PCSK9, as well as NPC 1L1. As well as several mutations affecting sterile absorption and increased risk for obesity.  Additional mutation in APO E4, strongly associated with increased risk of Alzheimer's. Two of these variants considered cardioprotective, however several still suggest risk for cardiovascular disease.  Clinic visit 08/21/2023 with Dr. Mona.  She did not go on Repatha  after genetic testing.  She had retired from [employer].  After a lengthy discussion, she was agreeable to start Repatha  140 mg every 14 days.  Follow-up lab testing completed 11/23/2023 revealed  LDL particle #1256, LDL-C 890, HDL-C 93, triglycerides 113, total cholesterol 221, and small LDL-P 458. LP(a) is elevated.    History of Present Illness Discussed the use of AI scribe software for clinical note transcription with the patient, who gave verbal consent to proceed.  History of Present Illness Shelia Smith is a pleasant 68 year old female who presents for follow-up of dyslipidemia. She has been on Repatha  for three months to manage elevated lipoprotein A and LDL cholesterol levels. No issues with obtaining the medication currently. Recent lab results show an LDL particle number of 1256, LDL cholesterol of 109, HDL of 93, triglycerides of 113, and a small LDL particle number of 458. Lipoprotein A is elevated at 126. Her calcium score is zero. She is active with regular exercise including biking and weight bearing exercises, which she has been doing for several years. Issues with her SI joint have prevented her from walking recently. She denies chest pain, dyspnea, orthopnea, PND, palpitations, edema, presyncope or syncope. or other symptoms concerning for angina. She maintains a healthy diet with occasional lapses. Her A1c was 6.2% in April, and she is interested in monitoring her blood sugar levels due to potential side effects of Repatha .   ROS: See HPI  Studies Reviewed       Lipoprotein (a)  Date/Time Value Ref Range Status  11/23/2023 08:23 AM 126.8 (H) <75.0 nmol/L Final    Comment:    Note:  Values greater than or equal to 75.0 nmol/L may        indicate an independent risk factor for CHD,  but must be evaluated with caution when applied        to non-Caucasian populations due to the        influence of genetic factors on Lp(a) across        ethnicities.     Risk Assessment/Calculations           Physical Exam VS:  BP 122/80   Pulse 78   Ht 5' 5 (1.651 m)   Wt 189 lb (85.7 kg)   SpO2 97%   BMI 31.45 kg/m    Wt Readings from Last 3 Encounters:   11/26/23 189 lb (85.7 kg)  08/21/23 186 lb 12.8 oz (84.7 kg)  07/26/23 188 lb (85.3 kg)    GEN: Well nourished, well developed in no acute distress NECK: No JVD; No carotid bruits CARDIAC: RRR, no murmurs, rubs, gallops RESPIRATORY:  Clear to auscultation without rales, wheezing or rhonchi  ABDOMEN: Soft, non-tender, non-distended EXTREMITIES:  No edema; No deformity    Assessment & Plan Familial hypercholesterolemia  Hyperlipidemia LDL goal < 70 Elevated lipoprotein(a)   Genetically influenced familial hypercholesterolemia with elevated lipoprotein(a) with goal LDL < 70 despite calcium score of 0.  LP(a) elevated at 126.8.  Lipid panel completed 11/23/2023 with LDL particle #1256, LDL-C 890, HDL-C 93, triglycerides 113, total cholesterol 221, small LDL P458.  She is tolerating Repatha  without any concerning side effects.  She does not want to consider additional lipid lowering therapy at this time.  -Continue Repatha  therapy.  -Repeat NMR panel in six months along with A1C to ensure no spike in blood glucose on lipid lowering therapy -Continue heart healthy, whole food diet. High in protein and fiber -Continued active lifestyle along with aiming for at least 150 minutes of moderate intensity exercise each week   Pre-diabetes   Pre-diabetes present with an A1c of 6.2%. Repatha  may potentially increase blood glucose. -We will recheck A1c in six months with a lipid panel.   Cardiac risk counseling Calcium score of 0 on 06/19/19. Currently active with no chest pain, dyspnea, or other symptoms concerning for angina.  No indication for further ischemic evaluation at this time  -Continue lipid lowering therapy to aim for LDL < 70 -Report new symptoms such as chest discomfort, especially symptoms with exertion -Continue healthy lifestyle including regular physical activity as well as aiming for at least 150 minutes of moderate intensity exercise each week -Continue heart healthy, whole food  diet limiting saturated fat, sugar, processed foods, and other simple carbohydrates        Dispo: 1 year with Dr. Mona or me  Signed, Rosaline Bane, NP-C "

## 2023-11-26 ENCOUNTER — Ambulatory Visit (HOSPITAL_BASED_OUTPATIENT_CLINIC_OR_DEPARTMENT_OTHER): Admitting: Nurse Practitioner

## 2023-11-26 ENCOUNTER — Encounter (HOSPITAL_BASED_OUTPATIENT_CLINIC_OR_DEPARTMENT_OTHER): Payer: Self-pay | Admitting: Nurse Practitioner

## 2023-11-26 VITALS — BP 122/80 | HR 78 | Ht 65.0 in | Wt 189.0 lb

## 2023-11-26 DIAGNOSIS — E7841 Elevated Lipoprotein(a): Secondary | ICD-10-CM

## 2023-11-26 DIAGNOSIS — R7303 Prediabetes: Secondary | ICD-10-CM

## 2023-11-26 DIAGNOSIS — E785 Hyperlipidemia, unspecified: Secondary | ICD-10-CM

## 2023-11-26 DIAGNOSIS — Z7189 Other specified counseling: Secondary | ICD-10-CM

## 2023-11-26 DIAGNOSIS — E7801 Familial hypercholesterolemia: Secondary | ICD-10-CM

## 2023-11-26 NOTE — Patient Instructions (Signed)
 Medication Instructions:   Your physician recommends that you continue on your current medications as directed. Please refer to the Current Medication list given to you today.   *If you need a refill on your cardiac medications before your next appointment, please call your pharmacy*  Lab Work:  Your physician recommends that you return for a FASTING A1C/NMR in 6 months fasting after midnight. I will send you a mychart message.    If you have labs (blood work) drawn today and your tests are completely normal, you will receive your results only by: MyChart Message (if you have MyChart) OR A paper copy in the mail If you have any lab test that is abnormal or we need to change your treatment, we will call you to review the results.  Testing/Procedures:  None ordered.  Follow-Up: At [facility], you and your health needs are our priority.  As part of our continuing mission to provide you with exceptional heart care, our providers are all part of one team.  This team includes your primary Cardiologist (physician) and Advanced Practice Providers or APPs (Physician Assistants and Nurse Practitioners) who all work together to provide you with the care you need, when you need it.  Your next appointment:   1 year(s)  Provider:   Rosaline Bane, NP    We recommend signing up for the patient portal called MyChart.  Sign up information is provided on this After Visit Summary.  MyChart is used to connect with patients for Virtual Visits (Telemedicine).  Patients are able to view lab/test results, encounter notes, upcoming appointments, etc.  Non-urgent messages can be sent to your provider as well.   To learn more about what you can do with MyChart, go to [email or web address].   Other Instructions  Your physician wants you to follow-up in: 1 year.  You will receive a reminder letter in the mail two months in advance. If you don't receive a letter, please call our office to  schedule the follow-up appointment.

## 2024-01-02 ENCOUNTER — Telehealth: Payer: Self-pay | Admitting: Adult Health

## 2024-01-02 NOTE — Telephone Encounter (Signed)
 Patient dropped off document pre op clearance, to be filled out by provider. Patient requested to send it back via Fax within 7-days. Document is located in providers tray at front office.Please advise at Mobile [phone] (mobile)

## 2024-01-02 NOTE — Telephone Encounter (Signed)
 Noted

## 2024-01-03 ENCOUNTER — Ambulatory Visit: Admitting: Adult Health

## 2024-01-03 NOTE — Telephone Encounter (Signed)
 Pt has been scheduled.

## 2024-01-04 ENCOUNTER — Ambulatory Visit: Admitting: Adult Health

## 2024-01-04 ENCOUNTER — Ambulatory Visit: Payer: Self-pay | Admitting: Adult Health

## 2024-01-04 ENCOUNTER — Encounter: Payer: Self-pay | Admitting: Adult Health

## 2024-01-04 VITALS — BP 110/80 | HR 75 | Temp 97.9°F | Ht 65.0 in | Wt 190.0 lb

## 2024-01-04 DIAGNOSIS — Z01818 Encounter for other preprocedural examination: Secondary | ICD-10-CM | POA: Diagnosis not present

## 2024-01-04 LAB — CBC
HCT: 41.6 % (ref 36.0–46.0)
Hemoglobin: 13.6 g/dL (ref 12.0–15.0)
MCHC: 32.6 g/dL (ref 30.0–36.0)
MCV: 87.8 fl (ref 78.0–100.0)
Platelets: 234 K/uL (ref 150.0–400.0)
RBC: 4.74 Mil/uL (ref 3.87–5.11)
RDW: 13.6 % (ref 11.5–15.5)
WBC: 3.6 K/uL — ABNORMAL LOW (ref 4.0–10.5)

## 2024-01-04 LAB — COMPREHENSIVE METABOLIC PANEL WITH GFR
ALT: 42 U/L — ABNORMAL HIGH (ref 0–35)
AST: 40 U/L — ABNORMAL HIGH (ref 0–37)
Albumin: 4.3 g/dL (ref 3.5–5.2)
Alkaline Phosphatase: 73 U/L (ref 39–117)
BUN: 10 mg/dL (ref 6–23)
CO2: 28 meq/L (ref 19–32)
Calcium: 9.5 mg/dL (ref 8.4–10.5)
Chloride: 103 meq/L (ref 96–112)
Creatinine, Ser: 0.86 mg/dL (ref 0.40–1.20)
GFR: 70.34 mL/min (ref >60.00)
Glucose, Bld: 105 mg/dL — ABNORMAL HIGH (ref 70–99)
Potassium: 3.9 meq/L (ref 3.5–5.1)
Sodium: 140 meq/L (ref 135–145)
Total Bilirubin: 0.4 mg/dL (ref 0.2–1.2)
Total Protein: 6.8 g/dL (ref 6.0–8.3)

## 2024-01-04 LAB — PROTIME-INR
INR: 1 ratio (ref 0.8–1.0)
Prothrombin Time: 10.7 s (ref 9.6–13.1)

## 2024-01-04 NOTE — Progress Notes (Signed)
 Subjective:    Patient ID: Shelia Smith, female    DOB: 1955/07/12, 68 y.o.   MRN: [id number]  HPI 68 year old female who  has a past medical history of Anemia, Hyperlipidemia, OSA (obstructive sleep apnea), and Prediabetes.  She presents to the office today for pre operative clearance. Dr. Donaciano Sprang will be performing a right sided SI fusion. Surgery has not been schedule yet. She is ready to be out of pain. She has no questions or concerns about the surgery.     Review of Systems See HPI   Past Medical History:  Diagnosis Date   Anemia    no longer an issue since hysterectomy   Hyperlipidemia    diet controlled   OSA (obstructive sleep apnea)    no c-pap used   Prediabetes     Social History   Socioeconomic History   Marital status: Married    Spouse name: Not on file   Number of children: 2   Years of education: Not on file   Highest education level: Bachelor's degree (e.g., BA, AB, BS)  Occupational History   Not on file  Tobacco Use   Smoking status: Former   Smokeless tobacco: Never  Vaping Use   Vaping status: Never Used  Substance and Sexual Activity   Alcohol use: No    Alcohol/week: 0.0 standard drinks of alcohol   Drug use: No   Sexual activity: Yes  Other Topics Concern   Not on file  Social History Narrative   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   Married for 11 years    2 children both live in [place]   She enjoys painting, reading, Curator, and likes outdoors   Social Drivers of Corporate investment banker Strain: Low Risk  (01/03/2024)   Overall Financial Resource Strain (CARDIA)    Difficulty of Paying Living Expenses: Not hard at all  Food Insecurity: No Food Insecurity (01/03/2024)   Hunger Vital Sign    Worried About Running Out of Food in the Last Year: Never true    Ran Out of Food in the Last Year: Never true  Transportation Needs: No Transportation Needs (01/03/2024)   PRAPARE - Administrator, Civil Service  (Medical): No    Lack of Transportation (Non-Medical): No  Physical Activity: Sufficiently Active (01/03/2024)   Exercise Vital Sign    Days of Exercise per Week: 6 days    Minutes of Exercise per Session: 30 min  Stress: No Stress Concern Present (01/03/2024)   [employer] of Occupational Health - Occupational Stress Questionnaire    Feeling of Stress: Not at all  Social Connections: Unknown (01/03/2024)   Social Connection and Isolation Panel    Frequency of Communication with Friends and Family: Patient declined    Frequency of Social Gatherings with Friends and Family: Patient declined    Attends Religious Services: Patient declined    Database administrator or Organizations: Patient declined    Attends Banker Meetings: Not on file    Marital Status: Married  Intimate Partner Violence: Unknown (07/29/2021)   Received from [facility]   HITS    Physically Hurt: Not on file    Insult or Talk Down To: Not on file    Threaten Physical Harm: Not on file    Scream or Curse: Not on file    Past Surgical History:  Procedure Laterality Date   ABDOMINAL HYSTERECTOMY     still has  ovaries   CESAREAN SECTION  8011,8010, 1993,   x 3   COLONOSCOPY     MYOMECTOMY      Family History  Problem Relation Age of Onset   Stroke Brother    Stroke Other    Hypertension Mother    Diabetes Mother        ?   Hypertension Father    Prostate cancer Father    Hypertension Brother     Allergies  Allergen Reactions   Fenofibrate      Elevated Lft  Other Reaction(s): Not available  fenofibrate    Statins     Elevated LFTS    Current Outpatient Medications on File Prior to Visit  Medication Sig Dispense Refill   Calcium Citrate 150 MG CAPS daily.     cholecalciferol (VITAMIN D3) 25 MCG (1000 UNIT) tablet Take by mouth daily.     Evolocumab  (REPATHA  SURECLICK) 140 MG/ML SOAJ Inject 140 mg into the skin every 14 (fourteen) days. 2 mL 11   No current  facility-administered medications on file prior to visit.    BP 110/80   Pulse 75   Temp 97.9 F (36.6 C) (Oral)   Ht 5' 5 (1.651 m)   Wt 190 lb (86.2 kg)   SpO2 97%   BMI 31.62 kg/m       Objective:   Physical Exam Vitals and nursing note reviewed.  Constitutional:      Appearance: Normal appearance.  Cardiovascular:     Rate and Rhythm: Normal rate and regular rhythm.     Pulses: Normal pulses.     Heart sounds: Normal heart sounds.  Pulmonary:     Effort: Pulmonary effort is normal.     Breath sounds: Normal breath sounds.  Musculoskeletal:        General: Normal range of motion.  Skin:    General: Skin is warm and dry.  Neurological:     General: No focal deficit present.     Mental Status: She is alert and oriented to person, place, and time.  Psychiatric:        Mood and Affect: Mood normal.        Behavior: Behavior normal.        Thought Content: Thought content normal.        Judgment: Judgment normal.        Assessment & Plan:  1. Preoperative clearance (Primary) - She is low risk for this surgery.  - CBC; Future - Comprehensive metabolic panel with GFR; Future - Protime-INR; Future - EKG 12-Lead- SR with left anterior fascicular block, rate 72. Consistent with previous EKG in 2015.   Anvith Mauriello, NP

## 2024-01-21 ENCOUNTER — Other Ambulatory Visit (HOSPITAL_BASED_OUTPATIENT_CLINIC_OR_DEPARTMENT_OTHER): Payer: Self-pay

## 2024-01-21 MED ORDER — AMOXICILLIN-POT CLAVULANATE 875-125 MG PO TABS
1.0000 | ORAL_TABLET | Freq: Two times a day (BID) | ORAL | 0 refills | Status: DC
  Filled 2024-01-21: qty 10, 5d supply, fill #0

## 2024-02-19 ENCOUNTER — Encounter: Payer: Self-pay | Admitting: Adult Health

## 2024-02-19 ENCOUNTER — Telehealth: Payer: Self-pay | Admitting: Internal Medicine

## 2024-02-19 NOTE — Telephone Encounter (Signed)
 Patient calling in with question concerning her medication. Please advise

## 2024-02-19 NOTE — Telephone Encounter (Signed)
 Spoke with pt regarding her Repatha  and needing joint surgery. Pt states that she is taking her Repatha  every 2 weeks, however her surgeon has asked that she not take it this week on Thursday with surgery on Friday. Advised that our team does not usually advise pt's to hold repatha  before surgery however if her surgeon is asking this of her then her surgeon should also advise her on when to take her next dose. Pt is will check with her surgeon to see if she can take her repatha  next week or if she needs to hold for an additional week. Pt also has questions about recent liver function tests. Made pt aware that repatha  is not processed through the liver. Explained to pt that it would be a good idea to repeat labs 3 months from when labs were done to check if numbers are still elevated. Pt verbalizes understanding.

## 2024-02-19 NOTE — Telephone Encounter (Signed)
 FYI

## 2024-02-22 ENCOUNTER — Other Ambulatory Visit: Payer: Self-pay

## 2024-02-22 ENCOUNTER — Other Ambulatory Visit (HOSPITAL_COMMUNITY): Payer: Self-pay

## 2024-02-22 ENCOUNTER — Other Ambulatory Visit (HOSPITAL_BASED_OUTPATIENT_CLINIC_OR_DEPARTMENT_OTHER): Payer: Self-pay

## 2024-02-22 MED ORDER — OXYCODONE-ACETAMINOPHEN 10-325 MG PO TABS
1.0000 | ORAL_TABLET | Freq: Four times a day (QID) | ORAL | 0 refills | Status: AC | PRN
  Filled 2024-02-22: qty 20, 5d supply, fill #0

## 2024-02-22 MED ORDER — METHOCARBAMOL 500 MG PO TABS
500.0000 mg | ORAL_TABLET | Freq: Three times a day (TID) | ORAL | 0 refills | Status: AC | PRN
Start: 2024-02-22 — End: ?
  Filled 2024-02-22: qty 15, 5d supply, fill #0

## 2024-02-22 MED ORDER — ONDANSETRON 8 MG PO TBDP
8.0000 mg | ORAL_TABLET | Freq: Three times a day (TID) | ORAL | 0 refills | Status: AC | PRN
  Filled 2024-02-22: qty 15, 5d supply, fill #0

## 2024-03-03 ENCOUNTER — Other Ambulatory Visit: Payer: Self-pay

## 2024-03-03 ENCOUNTER — Other Ambulatory Visit (HOSPITAL_BASED_OUTPATIENT_CLINIC_OR_DEPARTMENT_OTHER): Payer: Self-pay

## 2024-03-03 MED ORDER — CIPROFLOXACIN HCL 250 MG PO TABS
250.0000 mg | ORAL_TABLET | Freq: Two times a day (BID) | ORAL | 0 refills | Status: AC
  Filled 2024-03-03 (×2): qty 10, 5d supply, fill #0

## 2024-05-05 ENCOUNTER — Encounter (HOSPITAL_BASED_OUTPATIENT_CLINIC_OR_DEPARTMENT_OTHER): Payer: Self-pay

## 2024-05-05 ENCOUNTER — Other Ambulatory Visit (HOSPITAL_BASED_OUTPATIENT_CLINIC_OR_DEPARTMENT_OTHER): Payer: Self-pay | Admitting: *Deleted

## 2024-05-05 DIAGNOSIS — E7800 Pure hypercholesterolemia, unspecified: Secondary | ICD-10-CM

## 2024-05-05 DIAGNOSIS — Z79899 Other long term (current) drug therapy: Secondary | ICD-10-CM

## 2024-05-10 LAB — HEMOGLOBIN A1C
Est. average glucose Bld gHb Est-mCnc: 123 mg/dL
Hgb A1c MFr Bld: 5.9 % — ABNORMAL HIGH (ref 4.8–5.6)

## 2024-05-10 LAB — LIPID PANEL
Chol/HDL Ratio: 2.6 ratio (ref 0.0–4.4)
Cholesterol, Total: 230 mg/dL — ABNORMAL HIGH (ref 100–199)
HDL: 90 mg/dL
LDL Chol Calc (NIH): 124 mg/dL — ABNORMAL HIGH (ref 0–99)
Triglycerides: 96 mg/dL (ref 0–149)
VLDL Cholesterol Cal: 16 mg/dL (ref 5–40)

## 2024-05-11 ENCOUNTER — Ambulatory Visit (HOSPITAL_BASED_OUTPATIENT_CLINIC_OR_DEPARTMENT_OTHER): Payer: Self-pay | Admitting: Nurse Practitioner

## 2024-05-21 ENCOUNTER — Other Ambulatory Visit (HOSPITAL_BASED_OUTPATIENT_CLINIC_OR_DEPARTMENT_OTHER): Payer: Self-pay

## 2024-06-12 ENCOUNTER — Ambulatory Visit (HOSPITAL_BASED_OUTPATIENT_CLINIC_OR_DEPARTMENT_OTHER): Admitting: Nurse Practitioner
